# Patient Record
Sex: Male | Born: 1965 | Race: White | Hispanic: No | Marital: Married | Smoking: Never smoker
Health system: Southern US, Community
[De-identification: ages and names within clinical notes are randomized; demographics above are authoritative.]

## PROBLEM LIST (undated history)

## (undated) DIAGNOSIS — F419 Anxiety disorder, unspecified: Secondary | ICD-10-CM

## (undated) DIAGNOSIS — M1990 Unspecified osteoarthritis, unspecified site: Secondary | ICD-10-CM

## (undated) DIAGNOSIS — F32A Depression, unspecified: Secondary | ICD-10-CM

## (undated) DIAGNOSIS — K219 Gastro-esophageal reflux disease without esophagitis: Secondary | ICD-10-CM

## (undated) DIAGNOSIS — M19079 Primary osteoarthritis, unspecified ankle and foot: Secondary | ICD-10-CM

## (undated) HISTORY — PX: HERNIA REPAIR: SHX51

## (undated) HISTORY — PX: VASECTOMY: SHX75

## (undated) HISTORY — PX: APPENDECTOMY: SHX54

## (undated) HISTORY — PX: EYE SURGERY: SHX253

---

## 2004-03-25 ENCOUNTER — Emergency Department (HOSPITAL_COMMUNITY): Admission: EM | Admit: 2004-03-25 | Discharge: 2004-03-25 | Payer: Self-pay | Admitting: Emergency Medicine

## 2007-01-26 ENCOUNTER — Ambulatory Visit (HOSPITAL_BASED_OUTPATIENT_CLINIC_OR_DEPARTMENT_OTHER): Admission: RE | Admit: 2007-01-26 | Discharge: 2007-01-26 | Payer: Self-pay | Admitting: Orthopedic Surgery

## 2010-12-10 NOTE — Op Note (Signed)
Jeff Lee, Jeff Lee                   ACCOUNT NO.:  [id number]   MEDICAL RECORD NO.:  [id number]          PATIENT TYPE:  AMB   LOCATION:  DSC                          FACILITY:  [facility]   PHYSICIAN:  Leonides Grills, M.D.     DATE OF BIRTH:  1965-11-15   DATE OF PROCEDURE:  01/26/2007  DATE OF DISCHARGE:                               OPERATIVE REPORT   PREOPERATIVE DIAGNOSIS:  1. Left anterior ankle impingement.  2. Left anterior distal tibial spur.  3. Left ankle instability.   POSTOPERATIVE DIAGNOSIS:  1. Left anterior ankle impingement.  2. Left anterior distal tibial spur.  3. Left ankle instability.   OPERATION:  1. Left ankle arthroscopy with extensive debridement.  2. Excision left anterior distal tibial spurs.  3. Left modified Brostrom procedure.   ANESTHESIA:  General with block.   SURGEON:  Leonides Grills, M.D.   ASSISTANT:  Evlyn Kanner, P.A.-C.   ESTIMATED BLOOD LOSS:  Minimal.   TOURNIQUET TIME:  Approximately 1 hour.   COMPLICATIONS:  None.   DISPOSITION:  Stable to the PR.   INDICATIONS:  This is a 45 year old male who has had long-standing ankle  pain that is interfering with his life to the point that he could not do  what he wants to do.  He was consented for the above procedure.  All  risks which include infection, nerve and vessel injury, persistent pain,  worsening pain, prolonged recovery, stiffness, arthritis, and possible  future surgery, were all explained.  Questions were encouraged and  answered.   DESCRIPTION OF PROCEDURE:  The patient was brought to the operating room  and placed in supine position. After adequate general endotracheal  anesthesia was administered with block as well as Ancef 1 gram IV  piggyback, the patient was then placed in a sloppy lateral position with  the operative left side up on a bean bag.  All bony prominences were  well padded.  The left lower extremity was prepped and draped in a  sterile manner.  A proximally  placed thigh tourniquet was placed, the  limb was gravity exsanguinated, the tourniquet elevated to 290 mmHg.  Anatomical landmarks including anterior tibialis, peroneus and  superficial peroneal nerve were mapped out.   A spinal needle was then placed just medial to the anterior tibialis  tendon into the ankle and 20 mL of normal saline was instilled in the  ankle.  Weston Brass and spread technique was utilized to create the  anteromedial portal.  A blunt tip trocar with cannula followed by a  camera was then placed in the ankle. Under direct visualization,  anterolateral portal was created with a spinal needle followed by nick  and spread technique lateral to the peroneus tertius tendon and  superficial peroneal nerve.  Due to tremendous amount of synovitis over  the entire anterior aspect of the ankle and extending anteromedially and  anterolaterally, an extensive debridement was performed removing all  inflammatory tissue and dissecting out the anterior distal tibial  osteophyte.  This actually also extended into the anterior aspect of the  fibula which  had a large spur, as well, that required removal.  Once  debridement was done, pictures were obtained throughout the procedure,  the camera was removed.   We then made a longitudinal incision in the midline over the lateral  malleolus.  Dissection was carried down through the skin.  Hemostasis  was obtained.  The extensor retinaculum was then developed and freed up  for later advancement for the modification of the Brostrom procedure.  We then made an incision within the anterolateral capsule, approximately  2 mm from its insertion on the lateral malleolus.  Once this was opened,  we were able to see the osteophytes clearly. With an ankle arthrotomy  done and extending this dorsally, we removed the anterior fibular  osteophyte as well as anterior distal tibial osteophytes using a curved  corners osteotome and rongeur.  Once this was  completed, the range of  motion of the ankle had no impingement.  We then copiously irrigated the  area with normal saline.  There was isolated bone-on-bone arthritis of  the anterolateral aspect of the ankle that was about 1/3 of the joint  surface. The remaining part of cartilage looked in excellent shape.   We then prepared the anterior distal fibula and advanced the capsule  with the ankle in neutral dorsiflexion and eversion using mini-suture  anchors that are absorbable with 2-0 FiberWire from [employer]. This was  done with 2 mm drill holes.  Two anchors were placed.  Once the capsule  was advanced, we then repaired the rest of the capsule with 2-0  FiberWire to the remaining stump of capsule/periosteum. We then advanced  the extensor retinaculum and repaired this with 2-0 FiberWire stitch.  This had a good repair.  The area was copiously irrigated with normal  saline.  Range of motion of the ankle was performed.  The tourniquet was  deflated, hemostasis was obtained.  We copiously irrigated the area with  normal saline.  The subcu was closed with 3-0 Vicryl, the skin was  closed with 4-0 nylon over all wounds.  A sterile dressing was applied.  A modified Jones dressing was applied with the ankle in neutral  dorsiflexion and eversion. The patient was stable to the PR.      Leonides Grills, M.D.  Electronically Signed     PB/MEDQ  D:  01/26/2007  T:  01/26/2007  Job:  [id number]

## 2010-12-13 NOTE — Op Note (Signed)
NAME:  Jeff Lee, Jeff Lee                             ACCOUNT NO.:  [id number]   MEDICAL RECORD NO.:  [id number]                   PATIENT TYPE:  EMS   LOCATION:  MAJO                                 FACILITY:  [facility]   PHYSICIAN:  Nadara Mustard, M.D.                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  The patient was initially seen at [facility].  Vital signs at [facility]:  Blood pressure 138/110, pulse 80,  respiratory rate 20, weight 263 pounds, height 6 feet 3 inches.  The patient  was evaluated and felt to require a hand specialist.  The patient was then  referred to the [facility] on [facility] and after contact with the hand  center, they referred him to the emergency room and he is seen at this time  in consultation for Bethann Berkshire, MD for evaluation and treatment of the  left hand crush injury.   PAST MEDICAL HISTORY:  The injury occurred at approximately noon.  He was  seen in the emergency room at approximately 6:30 p.m.  The patient has  already received 1 g of Kefzol and his tetanus is up to date.   REVIEW OF SYSTEMS:  Negative.   HISTORY OF PRESENT ILLNESS:  As above.   SOCIAL HISTORY:  Does not smoke or drink.   PAST MEDICAL HISTORY:  Negative.   PHYSICAL EXAMINATION:  On physical exam, the patient is in moderate  distress, oriented times three, normal affect and mood.  On examination of  the left hand he has a crush injury to the long and ring finger.  The nail  is avulsed on the long finger.  The palmar aspect also has a full thickness  skin defect between the PIP and DIP joints on the palmar  aspect of both  fingers and this was a full thickness skin defect but does not go down to  the soft tissue.  Neurologically, the patient is intact.  Has decreased  sensation over the ring and long finger.  Vascular, he has good pulses.  Tendons are intact in all digits.  He is uninjured above the hand with the  shoulder, forearm and elbow intact.  The remainder of his exam was  essentially within normal limits with normal head, neck, chest and abdomen  exam.   Radiograph shows a fracture of the tuft of the left long and ring finger  with an open fracture of the long finger.   ASSESSMENT:  Crush injury, left hand, long and ring finger with tuft  fractures of the ring and long finger and an open fracture of the long  finger.   PLAN:  Patient's hand was initially soaked in peroxide, Betadine and saline.  After soaking, the hand was removed.  He underwent a digital block with 10  mL of 1% lidocaine plain of the long finger and the hand was then scrubbed  with a Betadine scrub brush on both the ring and long finger.  The hand was  then draped into a sterile field and again the hand was now irrigated with  normal saline.  The nail was removed from the long finger and he had a  stellate fracture across the nail bed.  This was cleansed again and was  closed with a 5-0 chromic gut.  The nail was cleansed and was placed back as  a biologic split and sutured in place.  Examination of the ring finger, the  subungual hematoma was decompressed and the nail was intact with no nail bed  injury that required repair. The subungual  hematoma was less than 50% of the nail.  The volar skin defects were covered  with Xeroform.  The fingers were also covered with Xeroform, sterile  dressing, Kerlix and a bulky dressing was applied.  The patient was then  discharged to home with prescriptions for Vicodin and Keflex with follow-up  in the office in two days.                                                Nadara Mustard, M.D.    MVD/MEDQ  D:  03/25/2004  T:  03/26/2004  Job:  [id number]

## 2017-08-26 ENCOUNTER — Other Ambulatory Visit: Payer: Self-pay | Admitting: Orthopedic Surgery

## 2017-09-09 NOTE — Pre-Procedure Instructions (Signed)
Jeff Lee  09/09/2017      [facility] - [place], [place] - [address] [place] [address] Phone: [phone] Fax: [phone]    Your procedure is scheduled on Thursday, Feb. 21st.   Report to [facility] Admitting at 5:30AM   Call this number if you have problems the morning of surgery:  [phone]   Remember:              4-5 days prior to surgery, STOP TAKING any Vitamins, Herbal Supplements, Anti-inflammatories, Blood thinners.   Do not eat food or drink liquids after midnight, Wednesday.   Take these medicines the morning of surgery - NOTHING   Do not wear jewelry - NO rings or watches.  Do not wear lotions, colognes or deodorant.  Men may shave face and neck.   Do not bring valuables to the hospital.  [facility] is not responsible for any belongings or valuables.  Contacts, dentures or bridgework may not be worn into surgery.  Leave your suitcase in the car.  After surgery it may be brought to your room.  For patients admitted to the hospital, discharge time will be determined by your treatment team.  Please read over the following fact sheets that you were given. Pain Booklet, MRSA Information and Surgical Site Infection Prevention      - Preparing For Surgery  Before surgery, you can play an important role. Because skin is not sterile, your skin needs to be as free of germs as possible. You can reduce the number of germs on your skin by washing with CHG (chlorahexidine gluconate) Soap before surgery.  CHG is an antiseptic cleaner which kills germs and bonds with the skin to continue killing germs even after washing.  Please do not use if you have an allergy to CHG or antibacterial soaps. If your skin becomes reddened/irritated stop using the CHG.  Do not shave (including legs and underarms) for at least 48 hours prior to first CHG shower. It is OK to shave your face.  Please follow these instructions  carefully.   1. Shower the NIGHT BEFORE SURGERY and the MORNING OF SURGERY with CHG.   2. If you chose to wash your hair, wash your hair first as usual with your normal shampoo.  3. After you shampoo, rinse your hair and body thoroughly to remove the shampoo.  4. Use CHG as you would any other liquid soap. You can apply CHG directly to the skin and wash gently with a scrungie or a clean washcloth.   5. Apply the CHG Soap to your body ONLY FROM THE NECK DOWN.  Do not use on open wounds or open sores. Avoid contact with your eyes, ears, mouth and genitals (private parts). Wash Face and genitals (private parts)  with your normal soap.  6. Wash thoroughly, paying special attention to the area where your surgery will be performed.  7. Thoroughly rinse your body with warm water from the neck down.  8. DO NOT shower/wash with your normal soap after using and rinsing off the CHG Soap.  9. Pat yourself dry with a CLEAN TOWEL.  10. Wear CLEAN PAJAMAS to bed the night before surgery, wear comfortable clothes the morning of surgery  11. Place CLEAN SHEETS on your bed the night of your first shower and DO NOT SLEEP WITH PETS.    Day of Surgery: Do not apply any deodorants/lotions. Please wear clean clothes to  the hospital/surgery center.

## 2017-09-10 ENCOUNTER — Encounter (HOSPITAL_COMMUNITY): Payer: Self-pay

## 2017-09-10 ENCOUNTER — Other Ambulatory Visit: Payer: Self-pay

## 2017-09-10 ENCOUNTER — Encounter (HOSPITAL_COMMUNITY)
Admission: RE | Admit: 2017-09-10 | Discharge: 2017-09-10 | Disposition: A | Discharge status: Home / Self Care | Payer: BLUE CROSS/BLUE SHIELD | Source: Ambulatory Visit | Attending: Orthopedic Surgery | Admitting: Orthopedic Surgery

## 2017-09-10 DIAGNOSIS — R002 Palpitations: Secondary | ICD-10-CM | POA: Insufficient documentation

## 2017-09-10 DIAGNOSIS — Z01810 Encounter for preprocedural cardiovascular examination: Secondary | ICD-10-CM | POA: Diagnosis present

## 2017-09-10 DIAGNOSIS — M19172 Post-traumatic osteoarthritis, left ankle and foot: Secondary | ICD-10-CM | POA: Diagnosis not present

## 2017-09-10 HISTORY — DX: Gastro-esophageal reflux disease without esophagitis: K21.9

## 2017-09-10 HISTORY — DX: Unspecified osteoarthritis, unspecified site: M19.90

## 2017-09-10 LAB — CBC
HCT: 41 % (ref 39.0–52.0)
Hemoglobin: 14.1 g/dL (ref 13.0–17.0)
MCH: 29.4 pg (ref 26.0–34.0)
MCHC: 34.4 g/dL (ref 30.0–36.0)
MCV: 85.4 fL (ref 78.0–100.0)
Platelets: 192 10*3/uL (ref 150–400)
RBC: 4.8 MIL/uL (ref 4.22–5.81)
RDW: 13.4 % (ref 11.5–15.5)
WBC: 5.8 10*3/uL (ref 4.0–10.5)

## 2017-09-10 LAB — SURGICAL PCR SCREEN
MRSA, PCR: NEGATIVE
Staphylococcus aureus: NEGATIVE

## 2017-09-10 LAB — BASIC METABOLIC PANEL
Anion gap: 8 (ref 5–15)
BUN: 16 mg/dL (ref 6–20)
CO2: 24 mmol/L (ref 22–32)
CREATININE: 0.88 mg/dL (ref 0.61–1.24)
Calcium: 9.1 mg/dL (ref 8.9–10.3)
Chloride: 107 mmol/L (ref 101–111)
GFR calc Af Amer: >60 mL/min (ref >60)
GLUCOSE: 98 mg/dL (ref 65–99)
POTASSIUM: 4.4 mmol/L (ref 3.5–5.1)
Sodium: 139 mmol/L (ref 135–145)

## 2017-09-10 NOTE — Progress Notes (Signed)
PCP is Dr. Grace BlightJ Slatosky at [facility]  LOV 05/2017 He states that approx 10 yrs ago, did have stress test for "palpitations".  Normal results.  Denies any irregularities now, no murmur, sob.

## 2017-09-16 NOTE — Anesthesia Preprocedure Evaluation (Addendum)
Anesthesia Evaluation  Patient identified by MRN, date of birth, ID band Patient awake    Reviewed: Allergy & Precautions, NPO status , Patient's Chart, lab work & pertinent test results  Airway Mallampati: III  TM Distance: >3 FB Neck ROM: Full    Dental no notable dental hx.    Pulmonary neg pulmonary ROS,    Pulmonary exam normal breath sounds clear to auscultation       Cardiovascular negative cardio ROS Normal cardiovascular exam Rhythm:Regular Rate:Normal  ECG: NSR, rate 62   Neuro/Psych negative neurological ROS  negative psych ROS   GI/Hepatic Neg liver ROS, GERD  Controlled,  Endo/Other  negative endocrine ROS  Renal/GU negative Renal ROS  negative genitourinary   Musculoskeletal negative musculoskeletal ROS (+)   Abdominal   Peds  Hematology negative hematology ROS (+)   Anesthesia Other Findings Left ankle post-traumatic arthritis  Reproductive/Obstetrics                            Anesthesia Physical Anesthesia Plan  ASA: II  Anesthesia Plan: General and Regional   Post-op Pain Management: GA combined w/ Regional for post-op pain   Induction: Intravenous  PONV Risk Score and Plan: 2 and Ondansetron, Dexamethasone, Midazolam and Treatment may vary due to age or medical condition  Airway Management Planned: Oral ETT  Additional Equipment:   Intra-op Plan:   Post-operative Plan: Extubation in OR  Informed Consent: I have reviewed the patients History and Physical, chart, labs and discussed the procedure including the risks, benefits and alternatives for the proposed anesthesia with the patient or authorized representative who has indicated his/her understanding and acceptance.   Dental advisory given  Plan Discussed with: CRNA  Anesthesia Plan Comments:         Anesthesia Quick Evaluation

## 2017-09-17 ENCOUNTER — Inpatient Hospital Stay (HOSPITAL_COMMUNITY): Payer: BLUE CROSS/BLUE SHIELD | Admitting: Anesthesiology

## 2017-09-17 ENCOUNTER — Encounter (HOSPITAL_COMMUNITY)
Admission: RE | Disposition: A | Discharge status: Home / Self Care | Payer: Self-pay | Source: Home / Self Care | Attending: Orthopedic Surgery

## 2017-09-17 ENCOUNTER — Other Ambulatory Visit: Payer: Self-pay

## 2017-09-17 ENCOUNTER — Inpatient Hospital Stay (HOSPITAL_COMMUNITY)
Admission: RE | Admit: 2017-09-17 | Discharge: 2017-09-18 | DRG: 469 | Disposition: A | Discharge status: Home / Self Care | Payer: BLUE CROSS/BLUE SHIELD | Attending: Orthopedic Surgery | Admitting: Orthopedic Surgery

## 2017-09-17 ENCOUNTER — Inpatient Hospital Stay (HOSPITAL_COMMUNITY): Payer: BLUE CROSS/BLUE SHIELD

## 2017-09-17 ENCOUNTER — Encounter (HOSPITAL_COMMUNITY): Payer: Self-pay

## 2017-09-17 DIAGNOSIS — Z96662 Presence of left artificial ankle joint: Secondary | ICD-10-CM

## 2017-09-17 DIAGNOSIS — K219 Gastro-esophageal reflux disease without esophagitis: Secondary | ICD-10-CM | POA: Diagnosis present

## 2017-09-17 DIAGNOSIS — M19172 Post-traumatic osteoarthritis, left ankle and foot: Principal | ICD-10-CM | POA: Diagnosis present

## 2017-09-17 DIAGNOSIS — Z419 Encounter for procedure for purposes other than remedying health state, unspecified: Secondary | ICD-10-CM

## 2017-09-17 DIAGNOSIS — M25572 Pain in left ankle and joints of left foot: Secondary | ICD-10-CM | POA: Diagnosis present

## 2017-09-17 HISTORY — DX: Primary osteoarthritis, unspecified ankle and foot: M19.079

## 2017-09-17 HISTORY — PX: TOTAL ANKLE ARTHROPLASTY: SHX811

## 2017-09-17 SURGERY — ARTHROPLASTY, ANKLE, TOTAL
Anesthesia: Regional | Site: Ankle | Laterality: Left

## 2017-09-17 MED ORDER — SODIUM CHLORIDE 0.9 % IV SOLN: INTRAVENOUS | Status: DC

## 2017-09-17 MED ORDER — DEXAMETHASONE SODIUM PHOSPHATE 10 MG/ML IJ SOLN
INTRAMUSCULAR | Status: DC | PRN
  Administered 2017-09-17: 5 mg via INTRAVENOUS

## 2017-09-17 MED ORDER — GLYCOPYRROLATE 0.2 MG/ML IJ SOLN
INTRAMUSCULAR | Status: DC | PRN
  Administered 2017-09-17: 0.4 mg via INTRAVENOUS

## 2017-09-17 MED ORDER — CALCIUM CARBONATE ANTACID 500 MG PO CHEW
1.0000 | CHEWABLE_TABLET | Freq: Two times a day (BID) | ORAL | Status: DC | PRN
  Administered 2017-09-17: 200 mg via ORAL
  Filled 2017-09-17: qty 1

## 2017-09-17 MED ORDER — ONDANSETRON HCL 4 MG/2ML IJ SOLN
INTRAMUSCULAR | Status: AC
  Filled 2017-09-17: qty 2

## 2017-09-17 MED ORDER — HYDROMORPHONE HCL 1 MG/ML IJ SOLN
0.2500 mg | INTRAMUSCULAR | Status: DC | PRN
  Administered 2017-09-17 (×2): 0.5 mg via INTRAVENOUS

## 2017-09-17 MED ORDER — HYDROMORPHONE HCL 1 MG/ML IJ SOLN
INTRAMUSCULAR | Status: AC
  Filled 2017-09-17: qty 1

## 2017-09-17 MED ORDER — ONDANSETRON HCL 4 MG/2ML IJ SOLN: 4.0000 mg | Freq: Four times a day (QID) | INTRAMUSCULAR | Status: DC | PRN

## 2017-09-17 MED ORDER — LIDOCAINE HCL (CARDIAC) 20 MG/ML IV SOLN
INTRAVENOUS | Status: DC | PRN
  Administered 2017-09-17: 40 mg via INTRAVENOUS
  Administered 2017-09-17: 60 mg via INTRAVENOUS

## 2017-09-17 MED ORDER — ROCURONIUM BROMIDE 10 MG/ML (PF) SYRINGE
PREFILLED_SYRINGE | INTRAVENOUS | Status: AC
  Filled 2017-09-17: qty 5

## 2017-09-17 MED ORDER — OXYCODONE HCL 5 MG PO TABS
5.0000 mg | ORAL_TABLET | ORAL | Status: DC | PRN
  Administered 2017-09-17 – 2017-09-18 (×3): 5 mg via ORAL
  Filled 2017-09-17 (×5): qty 1

## 2017-09-17 MED ORDER — METHOCARBAMOL 500 MG PO TABS
500.0000 mg | ORAL_TABLET | Freq: Four times a day (QID) | ORAL | Status: DC | PRN
Start: 2017-09-17 — End: 2017-09-18
  Administered 2017-09-17 – 2017-09-18 (×3): 500 mg via ORAL
  Filled 2017-09-17 (×3): qty 1

## 2017-09-17 MED ORDER — VANCOMYCIN HCL 500 MG IV SOLR
INTRAVENOUS | Status: DC | PRN
  Administered 2017-09-17: 500 mg

## 2017-09-17 MED ORDER — PROMETHAZINE HCL 25 MG/ML IJ SOLN: 6.2500 mg | INTRAMUSCULAR | Status: DC | PRN

## 2017-09-17 MED ORDER — ROCURONIUM BROMIDE 100 MG/10ML IV SOLN
INTRAVENOUS | Status: DC | PRN
  Administered 2017-09-17: 50 mg via INTRAVENOUS

## 2017-09-17 MED ORDER — FENTANYL CITRATE (PF) 100 MCG/2ML IJ SOLN
INTRAMUSCULAR | Status: DC | PRN
  Administered 2017-09-17: 25 ug via INTRAVENOUS
  Administered 2017-09-17: 100 ug via INTRAVENOUS
  Administered 2017-09-17 (×2): 50 ug via INTRAVENOUS
  Administered 2017-09-17: 100 ug via INTRAVENOUS
  Administered 2017-09-17: 50 ug via INTRAVENOUS
  Administered 2017-09-17: 25 ug via INTRAVENOUS
  Administered 2017-09-17 (×2): 50 ug via INTRAVENOUS

## 2017-09-17 MED ORDER — FENTANYL CITRATE (PF) 250 MCG/5ML IJ SOLN
INTRAMUSCULAR | Status: AC
  Filled 2017-09-17: qty 5

## 2017-09-17 MED ORDER — MIDAZOLAM HCL 2 MG/2ML IJ SOLN
INTRAMUSCULAR | Status: AC
  Filled 2017-09-17: qty 2

## 2017-09-17 MED ORDER — PROPOFOL 10 MG/ML IV BOLUS
INTRAVENOUS | Status: AC
  Filled 2017-09-17: qty 20

## 2017-09-17 MED ORDER — ARTIFICIAL TEARS OPHTHALMIC OINT
TOPICAL_OINTMENT | OPHTHALMIC | Status: DC | PRN
  Administered 2017-09-17: 1 via OPHTHALMIC

## 2017-09-17 MED ORDER — OXYCODONE HCL 5 MG/5ML PO SOLN: 5.0000 mg | Freq: Once | ORAL | Status: DC | PRN

## 2017-09-17 MED ORDER — MIDAZOLAM HCL 5 MG/5ML IJ SOLN
INTRAMUSCULAR | Status: DC | PRN
  Administered 2017-09-17 (×2): 1 mg via INTRAVENOUS

## 2017-09-17 MED ORDER — NEOSTIGMINE METHYLSULFATE 5 MG/5ML IV SOSY
PREFILLED_SYRINGE | INTRAVENOUS | Status: AC
  Filled 2017-09-17: qty 5

## 2017-09-17 MED ORDER — ARTIFICIAL TEARS OPHTHALMIC OINT
TOPICAL_OINTMENT | OPHTHALMIC | Status: AC
  Filled 2017-09-17: qty 3.5

## 2017-09-17 MED ORDER — MORPHINE SULFATE (PF) 2 MG/ML IV SOLN
2.0000 mg | INTRAVENOUS | Status: DC | PRN
  Administered 2017-09-17 – 2017-09-18 (×4): 2 mg via INTRAVENOUS
  Filled 2017-09-17 (×4): qty 1

## 2017-09-17 MED ORDER — ENOXAPARIN SODIUM 40 MG/0.4ML ~~LOC~~ SOLN
40.0000 mg | SUBCUTANEOUS | Status: DC
  Administered 2017-09-18: 40 mg via SUBCUTANEOUS
  Filled 2017-09-17: qty 0.4

## 2017-09-17 MED ORDER — CHLORHEXIDINE GLUCONATE 4 % EX LIQD: 60.0000 mL | Freq: Once | CUTANEOUS | Status: DC

## 2017-09-17 MED ORDER — CEFAZOLIN SODIUM-DEXTROSE 2-4 GM/100ML-% IV SOLN
2.0000 g | INTRAVENOUS | Status: AC
  Administered 2017-09-17: 2 g via INTRAVENOUS
  Filled 2017-09-17: qty 100

## 2017-09-17 MED ORDER — LACTATED RINGERS IV SOLN
INTRAVENOUS | Status: DC | PRN
  Administered 2017-09-17 (×2): via INTRAVENOUS

## 2017-09-17 MED ORDER — SODIUM CHLORIDE 0.9 % IV SOLN
INTRAVENOUS | Status: DC
  Administered 2017-09-17: 13:00:00 via INTRAVENOUS

## 2017-09-17 MED ORDER — ONDANSETRON HCL 4 MG/2ML IJ SOLN
INTRAMUSCULAR | Status: DC | PRN
  Administered 2017-09-17: 4 mg via INTRAVENOUS

## 2017-09-17 MED ORDER — DIPHENHYDRAMINE HCL 12.5 MG/5ML PO ELIX: 12.5000 mg | ORAL_SOLUTION | ORAL | Status: DC | PRN

## 2017-09-17 MED ORDER — LIDOCAINE 2% (20 MG/ML) 5 ML SYRINGE
INTRAMUSCULAR | Status: AC
  Filled 2017-09-17: qty 5

## 2017-09-17 MED ORDER — METHOCARBAMOL 1000 MG/10ML IJ SOLN
500.0000 mg | Freq: Four times a day (QID) | INTRAVENOUS | Status: DC | PRN
  Filled 2017-09-17: qty 5

## 2017-09-17 MED ORDER — ONDANSETRON HCL 4 MG PO TABS: 4.0000 mg | ORAL_TABLET | Freq: Four times a day (QID) | ORAL | Status: DC | PRN

## 2017-09-17 MED ORDER — OXYCODONE HCL 5 MG PO TABS: 5.0000 mg | ORAL_TABLET | Freq: Once | ORAL | Status: DC | PRN

## 2017-09-17 MED ORDER — DOCUSATE SODIUM 100 MG PO CAPS
100.0000 mg | ORAL_CAPSULE | Freq: Two times a day (BID) | ORAL | Status: DC
  Administered 2017-09-17 – 2017-09-18 (×3): 100 mg via ORAL
  Filled 2017-09-17 (×3): qty 1

## 2017-09-17 MED ORDER — PROPOFOL 10 MG/ML IV BOLUS
INTRAVENOUS | Status: DC | PRN
  Administered 2017-09-17: 20 mg via INTRAVENOUS
  Administered 2017-09-17: 250 mg via INTRAVENOUS

## 2017-09-17 MED ORDER — ACETAMINOPHEN 650 MG RE SUPP: 650.0000 mg | RECTAL | Status: DC | PRN

## 2017-09-17 MED ORDER — 0.9 % SODIUM CHLORIDE (POUR BTL) OPTIME
TOPICAL | Status: DC | PRN
  Administered 2017-09-17: 1000 mL

## 2017-09-17 MED ORDER — DEXAMETHASONE SODIUM PHOSPHATE 10 MG/ML IJ SOLN
INTRAMUSCULAR | Status: AC
  Filled 2017-09-17: qty 1

## 2017-09-17 MED ORDER — VANCOMYCIN HCL 500 MG IV SOLR
INTRAVENOUS | Status: AC
  Filled 2017-09-17: qty 500

## 2017-09-17 MED ORDER — ROPIVACAINE HCL 5 MG/ML IJ SOLN
INTRAMUSCULAR | Status: DC | PRN
  Administered 2017-09-17: 45 mL via PERINEURAL

## 2017-09-17 MED ORDER — ACETAMINOPHEN 325 MG PO TABS
650.0000 mg | ORAL_TABLET | ORAL | Status: DC | PRN
  Administered 2017-09-18: 650 mg via ORAL
  Filled 2017-09-17: qty 2

## 2017-09-17 MED ORDER — NEOSTIGMINE METHYLSULFATE 10 MG/10ML IV SOLN
INTRAVENOUS | Status: DC | PRN
  Administered 2017-09-17: 3 mg via INTRAVENOUS

## 2017-09-17 MED ORDER — SENNA 8.6 MG PO TABS
1.0000 | ORAL_TABLET | Freq: Two times a day (BID) | ORAL | Status: DC
  Administered 2017-09-17 – 2017-09-18 (×3): 8.6 mg via ORAL
  Filled 2017-09-17 (×3): qty 1

## 2017-09-17 SURGICAL SUPPLY — 58 items
BANDAGE ACE 4X5 VEL STRL LF (GAUZE/BANDAGES/DRESSINGS) ×2 IMPLANT
BANDAGE ACE 6X5 VEL STRL LF (GAUZE/BANDAGES/DRESSINGS) ×2 IMPLANT
BANDAGE ESMARK 6X9 LF (GAUZE/BANDAGES/DRESSINGS) ×1 IMPLANT
BLADE OSC (BLADE) ×2 IMPLANT
BLADE RECIP (BLADE) ×2 IMPLANT
BLADE RECIPRO TAPERED (BLADE) ×5 IMPLANT
BLADE SURG 15 STRL LF DISP TIS (BLADE) ×2 IMPLANT
BLADE SURG 15 STRL SS (BLADE) ×6
BNDG CMPR 9X6 STRL LF SNTH (GAUZE/BANDAGES/DRESSINGS) ×1
BNDG ESMARK 6X9 LF (GAUZE/BANDAGES/DRESSINGS) ×3
CHLORAPREP W/TINT 26ML (MISCELLANEOUS) ×3 IMPLANT
CORE POLY SLIDING 8MM ANKLE (Ankle) ×2 IMPLANT
COVER SURGICAL LIGHT HANDLE (MISCELLANEOUS) ×3 IMPLANT
CUFF TOURNIQUET SINGLE 34IN LL (TOURNIQUET CUFF) ×3 IMPLANT
DISPOSABLES PACK SBI (PACKS) ×2 IMPLANT
DRAPE C-ARM 42X72 X-RAY (DRAPES) ×3 IMPLANT
DRAPE C-ARMOR (DRAPES) ×3 IMPLANT
DRAPE HALF SHEET 40X57 (DRAPES) IMPLANT
DRAPE U-SHAPE 47X51 STRL (DRAPES) ×3 IMPLANT
DRSG MEPILEX BORDER 4X4 (GAUZE/BANDAGES/DRESSINGS) ×1 IMPLANT
DRSG MEPITEL 3X4 ME34 (GAUZE/BANDAGES/DRESSINGS) ×2 IMPLANT
DRSG MEPITEL 4X7.2 (GAUZE/BANDAGES/DRESSINGS) ×1 IMPLANT
DRSG PAD ABDOMINAL 8X10 ST (GAUZE/BANDAGES/DRESSINGS) ×6 IMPLANT
ELECT REM PT RETURN 9FT ADLT (ELECTROSURGICAL) ×3
ELECTRODE REM PT RTRN 9FT ADLT (ELECTROSURGICAL) ×1 IMPLANT
GAUZE SPONGE 4X4 12PLY STRL (GAUZE/BANDAGES/DRESSINGS) ×2 IMPLANT
GLOVE BIO SURGEON STRL SZ8 (GLOVE) ×3 IMPLANT
GLOVE BIOGEL PI IND STRL 8 (GLOVE) ×2 IMPLANT
GLOVE BIOGEL PI INDICATOR 8 (GLOVE) ×4
GLOVE ECLIPSE 8.0 STRL XLNG CF (GLOVE) ×3 IMPLANT
GOWN STRL REUS W/ TWL LRG LVL3 (GOWN DISPOSABLE) ×1 IMPLANT
GOWN STRL REUS W/ TWL XL LVL3 (GOWN DISPOSABLE) ×2 IMPLANT
GOWN STRL REUS W/TWL LRG LVL3 (GOWN DISPOSABLE) ×3 IMPLANT
GOWN STRL REUS W/TWL XL LVL3 (GOWN DISPOSABLE) ×6
GUIDEWIRE GAMMA (WIRE) ×2 IMPLANT
IMPLANT TALTA STAR SZ M LF (Orthopedic Implant) ×2 IMPLANT
IMPLANT TIBIAL STAR SZ XL (Ankle) ×2 IMPLANT
KIT BASIN OR (CUSTOM PROCEDURE TRAY) ×3 IMPLANT
KIT ROOM TURNOVER OR (KITS) ×3 IMPLANT
NS IRRIG 1000ML POUR BTL (IV SOLUTION) ×3 IMPLANT
PACK ORTHO EXTREMITY (CUSTOM PROCEDURE TRAY) ×3 IMPLANT
PAD ARMBOARD 7.5X6 YLW CONV (MISCELLANEOUS) ×6 IMPLANT
PAD CAST 4YDX4 CTTN HI CHSV (CAST SUPPLIES) ×3 IMPLANT
PADDING CAST COTTON 4X4 STRL (CAST SUPPLIES) ×9
PADDING CAST COTTON 6X4 STRL (CAST SUPPLIES) ×3 IMPLANT
SPLINT FIBERGLASS 4X30 (CAST SUPPLIES) ×2 IMPLANT
STOCKINETTE TUBULAR SYNTH 6IN (GAUZE/BANDAGES/DRESSINGS) ×3 IMPLANT
SUCTION FRAZIER TIP 10 FR DISP (SUCTIONS) ×2 IMPLANT
SURGILUBE 2OZ TUBE FLIPTOP (MISCELLANEOUS) ×3 IMPLANT
SUT ETHILON 3 0 PS 1 (SUTURE) ×6 IMPLANT
SUT MNCRL AB 3-0 PS2 18 (SUTURE) ×6 IMPLANT
SUT VIC AB 0 CT1 27 (SUTURE) ×6
SUT VIC AB 0 CT1 27XBRD ANBCTR (SUTURE) ×2 IMPLANT
TOWEL OR 17X24 6PK STRL BLUE (TOWEL DISPOSABLE) ×5 IMPLANT
TOWEL OR 17X26 10 PK STRL BLUE (TOWEL DISPOSABLE) ×3 IMPLANT
TUBE CONNECTING 12'X1/4 (SUCTIONS) ×1
TUBE CONNECTING 12X1/4 (SUCTIONS) ×2 IMPLANT
YANKAUER SUCT BULB TIP NO VENT (SUCTIONS) ×3 IMPLANT

## 2017-09-17 NOTE — Anesthesia Procedure Notes (Signed)
Procedure Name: Intubation Date/Time: 09/17/2017 7:41 AM Performed by: Coralee RudFlores, Sandy Blouch, CRNA Pre-anesthesia Checklist: Patient identified, Suction available, Emergency Drugs available, Patient being monitored and Timeout performed Patient Re-evaluated:Patient Re-evaluated prior to induction Oxygen Delivery Method: Circle system utilized Preoxygenation: Pre-oxygenation with 100% oxygen Induction Type: IV induction Ventilation: Mask ventilation without difficulty Laryngoscope Size: Miller and 3 Grade View: Grade I Tube type: Oral Tube size: 7.5 mm Number of attempts: 1 Airway Equipment and Method: Stylet Placement Confirmation: ETT inserted through vocal cords under direct vision,  positive ETCO2,  CO2 detector and breath sounds checked- equal and bilateral Secured at: 22 cm Tube secured with: Tape Dental Injury: Teeth and Oropharynx as per pre-operative assessment  Comments: Intubation by Lara MulchJohn Harris SRNA

## 2017-09-17 NOTE — Op Note (Signed)
09/17/2017  10:03 AM  PATIENT:  Jeff Lee  52 y.o. male  PRE-OPERATIVE DIAGNOSIS:  Left ankle post-traumatic arthritis  POST-OPERATIVE DIAGNOSIS:  Left ankle post-traumatic arthritis  Procedure(s): LEFT TOTAL ANKLE ARTHOPLASTY  SURGEON:  Toni ArthursJohn Andrus Sharp, MD  ASSISTANT: Alfredo MartinezJustin Ollis, PA-C  ANESTHESIA:   General, regional  EBL:  minimal   TOURNIQUET:   Total Tourniquet Time Documented: Thigh (Left) - 100 minutes Total: Thigh (Left) - 100 minutes  COMPLICATIONS:  None apparent  DISPOSITION:  Extubated, awake and stable to recovery.  INDICATION FOR PROCEDURE: The patient is a 52 year old male without significant past medical history.  He has a long history of left ankle pain due to posttraumatic arthritis.  He has failed nonoperative treatment to date including activity modification, oral anti-inflammatories, bracing and shoe wear modifications.  He presents today for left total ankle replacement.  The risks and benefits of the alternative treatment options have been discussed in detail.  The patient wishes to proceed with surgery and specifically understands risks of bleeding, infection, nerve damage, blood clots, need for additional surgery, amputation and death.  PROCEDURE IN DETAIL:  After pre operative consent was obtained, and the correct operative site was identified, the patient was brought to the operating room and placed supine on the OR table.  Anesthesia was administered.  Pre-operative antibiotics were administered.  A surgical timeout was taken.  The left lower extremity was prepped and draped in standard sterile fashion with a tourniquet around the thigh.  The extremity was elevated and the tourniquet was inflated to 300 mmHg.  A longitudinal incision was made over the anterior ankle joint.  Dissection was carried down through the subtenons tissues.  The retinaculum was incised over the EHL tendon.  The interval between the tibialis anterior and EHL was developed.  The  neurovascular bundle was identified.  It was mobilized and retracted laterally.  The joint capsule was incised and elevated medially and laterally.  There was end-stage arthrosis of the ankle joint with large anterior osteophytes.  These were removed with a rongeur.  There was valgus erosion at the lateral aspect of the tibial plafond.  The external tibial alignment guide was then applied and aligned with the medial gutter to set the appropriate rotation.  AP and lateral radiographs confirmed appropriate alignment of the guide with the tibia.  The cutting guide was pinned in place.  The appropriate resection level was set with a lateral radiograph and the distal cutting block was pinned.  An oscillating saw was then used to cut the distal tibia.  The waist bone was removed.  The talar cutting guide was then applied and pinned.  The appropriate position was confirmed on lateral radiograph.  The talar cut was then made in the waste bone removed.  The talus was sized as a medium.  The anterior posterior cutting guide was then applied after confirming appropriate position on the lateral radiograph.  Anterior and posterior cuts were made in waist bone removed.  The medial lateral guide was then applied and the medial and lateral cuts made.  After all bone was removed the window trial was applied.  It was noted to fit appropriately.  It was pinned and the keel hole drilled and punched.  The wound was irrigated copiously.  Vanco myosin powder was sprinkled on the cut surface of bone and the talar component was impacted in position.  Appropriate position was confirmed on x-ray.  The distal tibia was measured and an extra large tibial trial  inserted.  It was noted to be appropriately aligned.  A trial polyethylene spacer was inserted.  The barrel holes were drilled and punched.  The trial was removed.  Wound was irrigated copiously.  Vancomycin powder was sprinkled on the cut surface of bone.  The tibial implant was inserted  and impacted into position.  Trial poly-was inserted.  Appropriate position of the tibial component was confirmed on AP and lateral radiographs.  Ligament balance medially and laterally was appropriate.  Ankle dorsiflexion was 15 degrees with knee extended.  The trial poly-was removed and replaced with an 8 mm polyethylene spacer.  The wound was then irrigated copiously.  The barrel holes were bone grafted.  The anterior capsule was closed with 0 Vicryl.  The extensor retinaculum was closed with 0 Vicryl.  Subtenons tissues were approximated with Monocryl, and the skin incision was closed with nylon.  Sterile dressings were applied followed by a well-padded short leg cast.  The tourniquet was released after application of the dressings.  The patient was awakened from anesthesia and transported to the recovery room in stable condition.  FOLLOW UP PLAN: Nonweightbearing on the left lower extremity.  Observation overnight for pain control.  Plan discharge home tomorrow on aspirin for DVT reflexes.  Follow-up in the office in 3 weeks.   Alfredo Martinez PA-C was present and scrubbed for the duration of the operative case. His assistance assistance was essential in positioning the patient, prepping and draping, gaining maintaining exposure, performing the operation, closing and dressing the wounds and applying the splint.

## 2017-09-17 NOTE — Discharge Instructions (Signed)
Jeff Hewitt, MD °[facility] ° °Please read the following information regarding your care after surgery. ° °Medications  °You only need a prescription for the narcotic pain medicine (ex. oxycodone, Percocet, Norco).  All of the other medicines listed below are available over the counter. °X Aleve 2 pills twice a day for the first 3 days after surgery. °X acetominophen (Tylenol) 650 mg every 4-6 hours as you need for minor to moderate pain °X oxycodone as prescribed for severe pain ° °Narcotic pain medicine (ex. oxycodone, Percocet, Vicodin) will cause constipation.  To prevent this problem, take the following medicines while you are taking any pain medicine. °X docusate sodium (Colace) 100 mg twice a day X senna (Senokot) 2 tablets twice a day ° °X To help prevent blood clots, take a baby aspirin (81 mg) twice a day after surgery.  You should also get up every hour while you are awake to move around.   ° °Weight Bearing °X Do not bear any weight on the operated leg or foot. ° °Cast / Splint / Dressing °X Keep your splint, cast or dressing clean and dry.  Don’t put anything (coat hanger, pencil, etc) down inside of it.  If it gets damp, use a hair dryer on the cool setting to dry it.  If it gets soaked, call the office to schedule an appointment for a cast change. °  ° °After your dressing, cast or splint is removed; you may shower, but do not soak or scrub the wound.  Allow the water to run over it, and then gently pat it dry. ° °Swelling °It is normal for you to have swelling where you had surgery.  To reduce swelling and pain, keep your toes above your nose for at least 3 days after surgery.  It may be necessary to keep your foot or leg elevated for several weeks.  If it hurts, it should be elevated. ° °Follow Up °Call my office at [phone] when you are discharged from the hospital or surgery center to schedule an appointment to be seen two weeks after surgery. ° °Call my office at [phone] if  you develop a fever >101.5° F, nausea, vomiting, bleeding from the surgical site or severe pain.   ° ° °

## 2017-09-17 NOTE — Anesthesia Procedure Notes (Addendum)
Anesthesia Regional Block: Adductor canal block (and saphenous)   Pre-Anesthetic Checklist: ,, timeout performed, Correct Patient, Correct Site, Correct Laterality, Correct Procedure,, site marked, risks and benefits discussed, Surgical consent,  Pre-op evaluation,  At surgeon's request and post-op pain management  Laterality: Left  Prep: chloraprep       Needles:  Injection technique: Single-shot  Needle Type: Echogenic Stimulator Needle     Needle Length: 9cm  Needle Gauge: 21     Additional Needles:   Procedures:,,,, ultrasound used (permanent image in chart),,,,  Narrative:  Start time: 09/17/2017 7:05 AM End time: 09/17/2017 7:15 AM Injection made incrementally with aspirations every 5 mL.  Performed by: Personally  Anesthesiologist: Leonides GrillsEllender, Ryan P, MD  Additional Notes: Functioning IV was confirmed and monitors were applied.  A 90mm 21ga Arrow echogenic stimulator needle was used. Sterile prep, hand hygiene and sterile gloves were used.  Negative aspiration and negative test dose prior to incremental administration of local anesthetic. The patient tolerated the procedure well.

## 2017-09-17 NOTE — Transfer of Care (Cosign Needed)
Immediate Anesthesia Transfer of Care Note  Patient: Jeff Lee  Procedure(s) Performed: LEFT TOTAL ANKLE ARTHOPLASTY (Left Ankle)  Patient Location: PACU  Anesthesia Type:General  Level of Consciousness: awake, alert , oriented and patient cooperative  Airway & Oxygen Therapy: Patient Spontanous Breathing and Patient connected to nasal cannula oxygen  Post-op Assessment: Report given to RN, Post -op Vital signs reviewed and stable, Patient moving all extremities, Patient moving all extremities X 4 and Patient able to stick tongue midline  Post vital signs: Reviewed and stable  Last Vitals:  Vitals:   09/17/17 0715 09/17/17 0716  BP:    Pulse: 79 79  Resp: 16 17  Temp:    SpO2: 100% 100%    Last Pain:  Vitals:   09/17/17 0616  TempSrc: Oral      Patients Stated Pain Goal: 3 (09/17/17 [id number])  Complications: No apparent anesthesia complications

## 2017-09-17 NOTE — H&P (Signed)
Jeff MylarRick L Lee is an 52 y.o. male.   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  He has failed non op treatment and presents today for left total ankle replacement.  Past Medical History:  Diagnosis Date  . Arthritis   . Arthritis of ankle    Post Traumatic, left  . GERD (gastroesophageal reflux disease)    TAKES OTC    Past Surgical History:  Procedure Laterality Date  . APPENDECTOMY    . EYE SURGERY     LASER EYE & MUSCLE CORRECTION  . HERNIA REPAIR     UMBILICAL & INGUINAL  . VASECTOMY      History reviewed. No pertinent family history. Social History:  reports that  has never smoked. he has never used smokeless tobacco. He reports that he drinks about 2.4 oz of alcohol per week. He reports that he does not use drugs.  Allergies: No Known Allergies  Medications Prior to Admission  Medication Sig Dispense Refill  . acetaminophen (TYLENOL) 500 MG tablet Take 2,000 mg by mouth daily as needed for moderate pain or headache.    . ibuprofen (ADVIL,MOTRIN) 200 MG tablet Take 400 mg by mouth daily as needed for headache or moderate pain.    . Menthol, Topical Analgesic, (BLUE-EMU MAXIMUM STRENGTH EX) Apply 1 application topically daily as needed (pain).    . Multiple Vitamin (MULTIVITAMIN WITH MINERALS) TABS tablet Take 1 tablet by mouth daily.      No results found for this or any previous visit (from the past 48 hour(s)). No results found.  ROS  No recent f/c/n/v/wt loss  Blood pressure (!) 152/59, pulse 79, temperature 98.4 F (36.9 C), temperature source Oral, resp. rate 17, height 6\' 3"  (1.905 m), weight 119.3 kg (263 lb), SpO2 100 %. Physical Exam  wn wd male in nad.  A and o x 4.  Mood and affect nromal.  EOMI.  resp unlabored.  L ankle with health skin.  No lymphadenopathy.  5/5 strength in PF and DF of the ankle and toes.  Sens to LT intact at the anterior ankle, dorsal and plantar foot.  Slight swelling at the ankle.   Small effusion.  2+ dp and pt pulses.  Assessment/Plan L ankle post traumatic arthritis - to OR for left total ankle replacement.  The risks and benefits of the alternative treatment options have been discussed in detail.  The patient wishes to proceed with surgery and specifically understands risks of bleeding, infection, nerve damage, blood clots, need for additional surgery, amputation and death.   Toni ArthursHEWITT, Evie Crumpler, MD 09/17/2017, 7:20 AM

## 2017-09-17 NOTE — Anesthesia Postprocedure Evaluation (Signed)
Anesthesia Post Note  Patient: Jeff Lee  Procedure(s) Performed: LEFT TOTAL ANKLE ARTHOPLASTY (Left Ankle)     Patient location during evaluation: PACU Anesthesia Type: Regional and General Level of consciousness: awake and alert Pain management: pain level controlled Vital Signs Assessment: post-procedure vital signs reviewed and stable Respiratory status: spontaneous breathing, nonlabored ventilation, respiratory function stable and patient connected to nasal cannula oxygen Cardiovascular status: blood pressure returned to baseline and stable Postop Assessment: no apparent nausea or vomiting Anesthetic complications: no    Last Vitals:  Vitals:   09/17/17 1123 09/17/17 1148  BP:  136/76  Pulse: 72 63  Resp: 14 16  Temp:    SpO2: 96% 98%    Last Pain:  Vitals:   09/17/17 1428  TempSrc:   PainSc: 3     LLE Motor Response: No movement due to regional block;Other (Comment)(cap refills normal, skin pink and warm) (09/17/17 1433) LLE Sensation: Numbness;Tingling;No pain (09/17/17 1433)          Catheryn Baconyan P Deborah Lazcano

## 2017-09-17 NOTE — Evaluation (Signed)
Physical Therapy Evaluation and Discharge Patient Details Name: Jeff MylarRick L Lee MRN: [id number] DOB: [birth year] Today's Date: 09/17/2017   History of Present Illness  52 y.o. male s/p L total ankle arthroplasty 2/21.   Clinical Impression  Patient evaluated by Physical Therapy with no further acute PT needs identified. All education has been completed and the patient has no further questions. PTA, pt independent with all mobility driving and working. Pt lives with wife who will be avail and has family local to assist after disharge in 1 story home with stair to enter. Upon eval, pt mod I with all mobility with little post op pain or weakness. Ambulating hallway with assistive devices and performing stairs without physical assistance. Pt reports confidence in returning to home and recommended eventual follow up with OP ortho after surgery has healed.  See below for any follow-up Physical Therapy or equipment needs. PT is signing off. Thank you for this referral.      Follow Up Recommendations No PT follow up;Supervision for mobility/OOB    Equipment Recommendations  None recommended by PT    Recommendations for Other Services       Precautions / Restrictions Precautions Precautions: None Restrictions Weight Bearing Restrictions: Yes LLE Weight Bearing: Non weight bearing      Mobility  Bed Mobility Overal bed mobility: Modified Independent                Transfers Overall transfer level: Modified independent Equipment used: Rolling walker (2 wheeled)                Ambulation/Gait Ambulation/Gait assistance: Modified independent (Device/Increase time) Ambulation Distance (Feet): 100 Feet Assistive device: Rolling walker (2 wheeled)(Knee Walker) Gait Pattern/deviations: Step-to pattern Gait velocity: decreased   General Gait Details: pt safe with adherence to NWB status. ambulated 50 feet with RW, then 100 feet with knee walker. preferes knee  walker.  Stairs Stairs: Yes Stairs assistance: Supervision Stair Management: No rails;Forwards Number of Stairs: 3 General stair comments: 3x1 platform cues for technique with RW. pt safe and able to perform without phsical assistance.   Wheelchair Mobility    Modified Rankin (Stroke Patients Only)       Balance Overall balance assessment: No apparent balance deficits (not formally assessed);History of Falls(can stand 1 leg without BUE support. BUE support for dynamic)                                           Pertinent Vitals/Pain Pain Assessment: No/denies pain    Home Living Family/patient expects to be discharged to:: Private residence Living Arrangements: Spouse/significant other Available Help at Discharge: Friend(s);Available 24 hours/day Type of Home: House Home Access: Stairs to enter Entrance Stairs-Rails: Can reach both Entrance Stairs-Number of Steps: 1 Home Layout: One level Home Equipment: Shower seat;Cane - quad;Walker - 2 wheels      Prior Function Level of Independence: Independent         Comments: independent all mobility, driving, working.     Hand Dominance        Extremity/Trunk Assessment   Upper Extremity Assessment Upper Extremity Assessment: Overall WFL for tasks assessed    Lower Extremity Assessment Lower Extremity Assessment: Overall WFL for tasks assessed    Cervical / Trunk Assessment Cervical / Trunk Assessment: Normal  Communication   Communication: No difficulties  Cognition Arousal/Alertness: Awake/alert  General Comments      Exercises     Assessment/Plan    PT Assessment Patent does not need any further PT services  PT Problem List         PT Treatment Interventions      PT Goals (Current goals can be found in the Care Plan section)  Acute Rehab PT Goals Patient Stated Goal: return home tomorrow PT Goal Formulation:  With patient Time For Goal Achievement: 09/24/17 Potential to Achieve Goals: Good    Frequency     Barriers to discharge        Co-evaluation               AM-PAC PT "6 Clicks" Daily Activity  Outcome Measure Difficulty turning over in bed (including adjusting bedclothes, sheets and blankets)?: None Difficulty moving from lying on back to sitting on the side of the bed? : None Difficulty sitting down on and standing up from a chair with arms (e.g., wheelchair, bedside commode, etc,.)?: None Help needed moving to and from a bed to chair (including a wheelchair)?: None Help needed walking in hospital room?: None Help needed climbing 3-5 steps with a railing? : A Little 6 Click Score: 23    End of Session Equipment Utilized During Treatment: Gait belt Activity Tolerance: Patient tolerated treatment well Patient left: in bed;with call bell/phone within reach Nurse Communication: Mobility status PT Visit Diagnosis: Other abnormalities of gait and mobility (R26.89)    Time: 1610-9604 PT Time Calculation (min) (ACUTE ONLY): 32 min   Charges:   PT Evaluation $PT Eval Low Complexity: 1 Low PT Treatments $Gait Training: 8-22 mins   PT G Codes:        Etta Grandchild, PT, DPT Acute Rehab Services Pager: [phone]    Etta Grandchild 09/17/2017, 5:04 PM

## 2017-09-18 ENCOUNTER — Encounter (HOSPITAL_COMMUNITY): Payer: Self-pay | Admitting: Orthopedic Surgery

## 2017-09-18 MED ORDER — ACETAMINOPHEN 500 MG PO TABS: 1000.0000 mg | ORAL_TABLET | Freq: Every day | ORAL | 0 refills | Status: AC | PRN

## 2017-09-18 MED ORDER — ASPIRIN EC 81 MG PO TBEC: 81.0000 mg | DELAYED_RELEASE_TABLET | Freq: Two times a day (BID) | ORAL | 0 refills | Status: DC

## 2017-09-18 MED ORDER — OXYCODONE HCL 5 MG PO TABS: 5.0000 mg | ORAL_TABLET | ORAL | 0 refills | Status: DC | PRN

## 2017-09-18 MED ORDER — SENNA 8.6 MG PO TABS: 2.0000 | ORAL_TABLET | Freq: Two times a day (BID) | ORAL | 0 refills | Status: DC

## 2017-09-18 MED ORDER — DOCUSATE SODIUM 100 MG PO CAPS: 100.0000 mg | ORAL_CAPSULE | Freq: Two times a day (BID) | ORAL | 0 refills | Status: DC

## 2017-09-18 NOTE — Progress Notes (Signed)
Subjective: 1 Day Post-Op Procedure(s) (LRB): LEFT TOTAL ANKLE ARTHOPLASTY (Left)  Patient reports pain as mild to moderate.  Tolerating POs well.  Admits to flatus.  Denies fever, chills, N/V, SOB, CP.  Reports that he is ready to go home.  Objective:   VITALS:  Temp:  [97.4 F (36.3 C)-99.1 F (37.3 C)] 98.3 F (36.8 C) (02/22 0555) Pulse Rate:  [59-89] 83 (02/22 0555) Resp:  [8-20] 17 (02/22 0555) BP: (114-142)/(53-81) 122/53 (02/22 0555) SpO2:  [92 %-99 %] 96 % (02/22 0555)  General: WDWN patient in NAD. Psych:  Appropriate mood and affect. Neuro:  A&O x 3, Moving all extremities, sensation intact to light touch HEENT:  EOMs intact Chest:  Even non-labored respirations Skin:  SLC C/D/I, no rashes or lesions Extremities: warm/dry, no visible edema, erythema, or echymosis.  No lymphadenopathy. Pulses: Popliteus 2+ MSK:  ROM: EHL/FHL intact, MMT: able to perform quad set    LABS No results for input(s): HGB, WBC, PLT in the last 72 hours. No results for input(s): NA, K, CL, CO2, BUN, CREATININE, GLUCOSE in the last 72 hours. No results for input(s): LABPT, INR in the last 72 hours.   Assessment/Plan: 1 Day Post-Op Procedure(s) (LRB): LEFT TOTAL ANKLE ARTHOPLASTY (Left)  NWB LLE D/C home today Scripts on chart ASA for DVT prophylaxis Plan for 3 week outpatient post-op visit with Dr. Victorino DikeHewitt.  Alfredo MartinezJustin Paisly [place], PA-C [facility] Office:  [phone]

## 2017-09-18 NOTE — Progress Notes (Signed)
Discharge instructions (including medications) discussed with and copy provided to patient/caregiver 

## 2017-09-18 NOTE — Care Management (Signed)
Patient has no case management needs. Has DME.

## 2017-09-18 NOTE — Discharge Summary (Signed)
Physician Discharge Summary  Patient ID: Jeff MylarRick L Lee MRN: [id number] DOB/AGE: May 30, 1966 52 y.o.  Admit date: 09/17/2017 Discharge date: 09/18/2017  Admission Diagnoses: L ankle osteoarthritis; GERD  Discharge Diagnoses:  Active Problems:   H/O total ankle replacement, left Same as above  Discharged Condition: stable  Hospital Course: [facility] for elective L total ankle replacement by Dr. Toni ArthursJohn Hewitt.  The patient tolerated the procedure well and was admitted to the hospital.  The patient worked well with therapy and was D/C'd home on 09/18/17.  He tolerated his stay well without any complications.  Consults: PT  Significant Diagnostic Studies: radiology: X-Ray: to ensure satisfactory anatomic alignment during operative procedure.  Treatments: IV hydration, antibiotics: Ancef, analgesia: acetaminophen, Vicodin and Morphine, anticoagulation: lovenox and surgery: as stated above.  Discharge Exam: Blood pressure (!) 122/53, pulse 83, temperature 98.3 F (36.8 C), temperature source Oral, resp. rate 17, height 6\' 3"  (1.905 m), weight 119.3 kg (263 lb), SpO2 96 %. General: WDWN patient in NAD. Psych:  Appropriate mood and affect. Neuro:  A&O x 3, Moving all extremities, sensation intact to light touch HEENT:  EOMs intact Chest:  Even non-labored respirations Skin:  SLC C/D/I, no rashes or lesions Extremities: warm/dry, no visible edema, erythema, or echymosis.  No lymphadenopathy. Pulses: Popliteus 2+ MSK:  ROM: EHL/FHL intact, MMT: able to perform quad set   Disposition: Home/self care  Discharge Instructions    Call MD / Call 911   Complete by:  As directed    If you experience chest pain or shortness of breath, CALL 911 and be transported to the hospital emergency room.  If you develope a fever above 101 F, pus (white drainage) or increased drainage or redness at the wound, or calf pain, call your surgeon's office.   Constipation Prevention    Complete by:  As directed    Drink plenty of fluids.  Prune juice may be helpful.  You may use a stool softener, such as Colace (over the counter) 100 mg twice a day.  Use MiraLax (over the counter) for constipation as needed.   Diet - low sodium heart healthy   Complete by:  As directed    Increase activity slowly as tolerated   Complete by:  As directed    Non weight bearing   Complete by:  As directed    Laterality:  left   Extremity:  Lower     Allergies as of 09/18/2017   No Known Allergies     Medication List    TAKE these medications   acetaminophen 500 MG tablet Commonly known as:  TYLENOL Take 2 tablets (1,000 mg total) by mouth daily as needed for moderate pain or headache. What changed:  how much to take   aspirin EC 81 MG tablet Take 1 tablet (81 mg total) by mouth 2 (two) times daily.   BLUE-EMU MAXIMUM STRENGTH EX Apply 1 application topically daily as needed (pain).   docusate sodium 100 MG capsule Commonly known as:  COLACE Take 1 capsule (100 mg total) by mouth 2 (two) times daily. While taking narcotic pain medicine.   ibuprofen 200 MG tablet Commonly known as:  ADVIL,MOTRIN Take 400 mg by mouth daily as needed for headache or moderate pain.   multivitamin with minerals Tabs tablet Take 1 tablet by mouth daily.   oxyCODONE 5 MG immediate release tablet Commonly known as:  ROXICODONE Take 1 tablet (5 mg total) by mouth every 4 (four)  hours as needed for moderate pain or severe pain. For no more than 5 days.   senna 8.6 MG Tabs tablet Commonly known as:  SENOKOT Take 2 tablets (17.2 mg total) by mouth 2 (two) times daily.            Discharge Care Instructions  (From admission, onward)        Start     Ordered   09/18/17 0000  Non weight bearing    Question Answer Comment  Laterality left   Extremity Lower      09/18/17 4098     Follow-up Information    Toni Arthurs, MD. Schedule an appointment as soon as possible for a visit in 3  week(s).   Specialty:  Orthopedic Surgery Contact information: [address] [place] [address] [phone]           Signed: Joana Reamer [facility] Office:  [phone]

## 2018-05-25 IMAGING — RF DG C-ARM 61-120 MIN
1 series · 5 of 5 positions shown · non-contrast
Comparison: No prior.

CLINICAL DATA: Total left ankle replacement.

EXAM:
LEFT ANKLE - 2 VIEW; DG C-ARM 61-120 MIN

[Series 1: run · 5 of 5 slices shown]
[im 1/5]
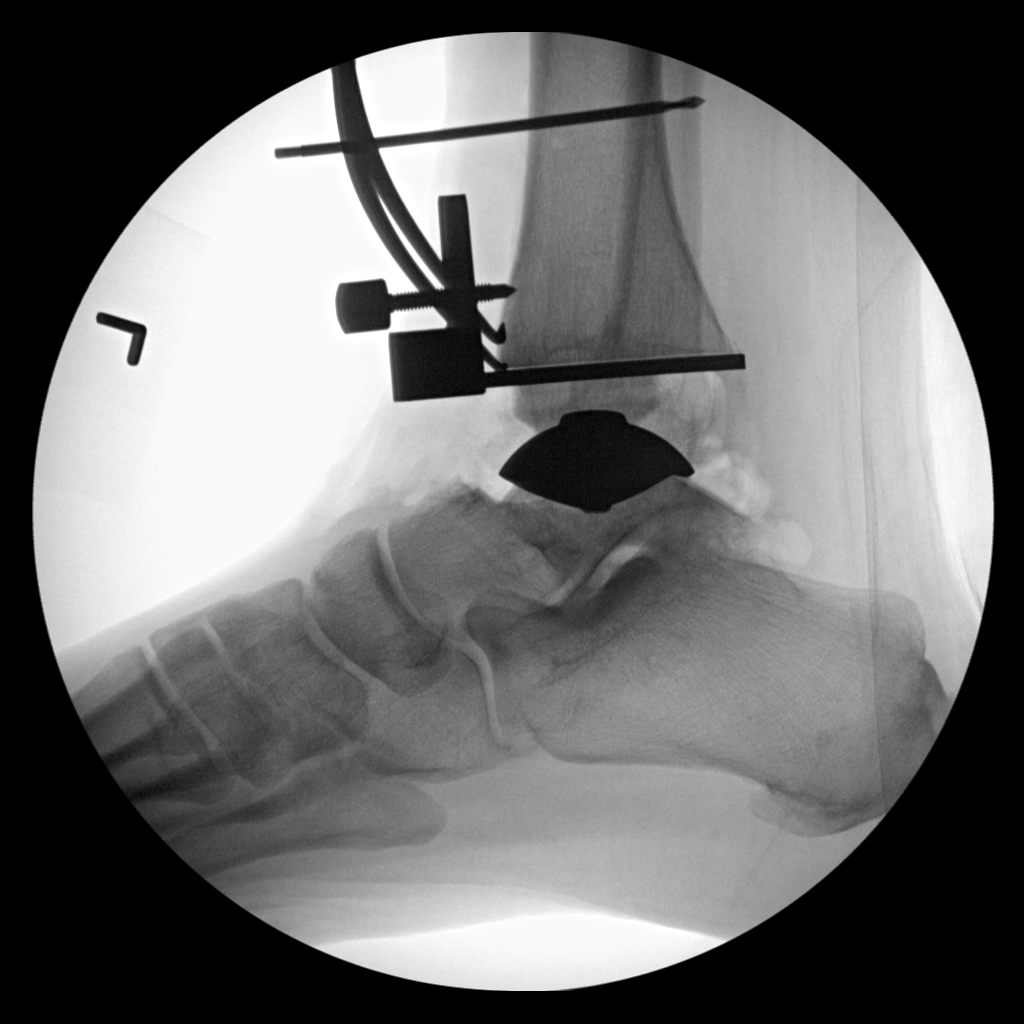
[im 2/5]
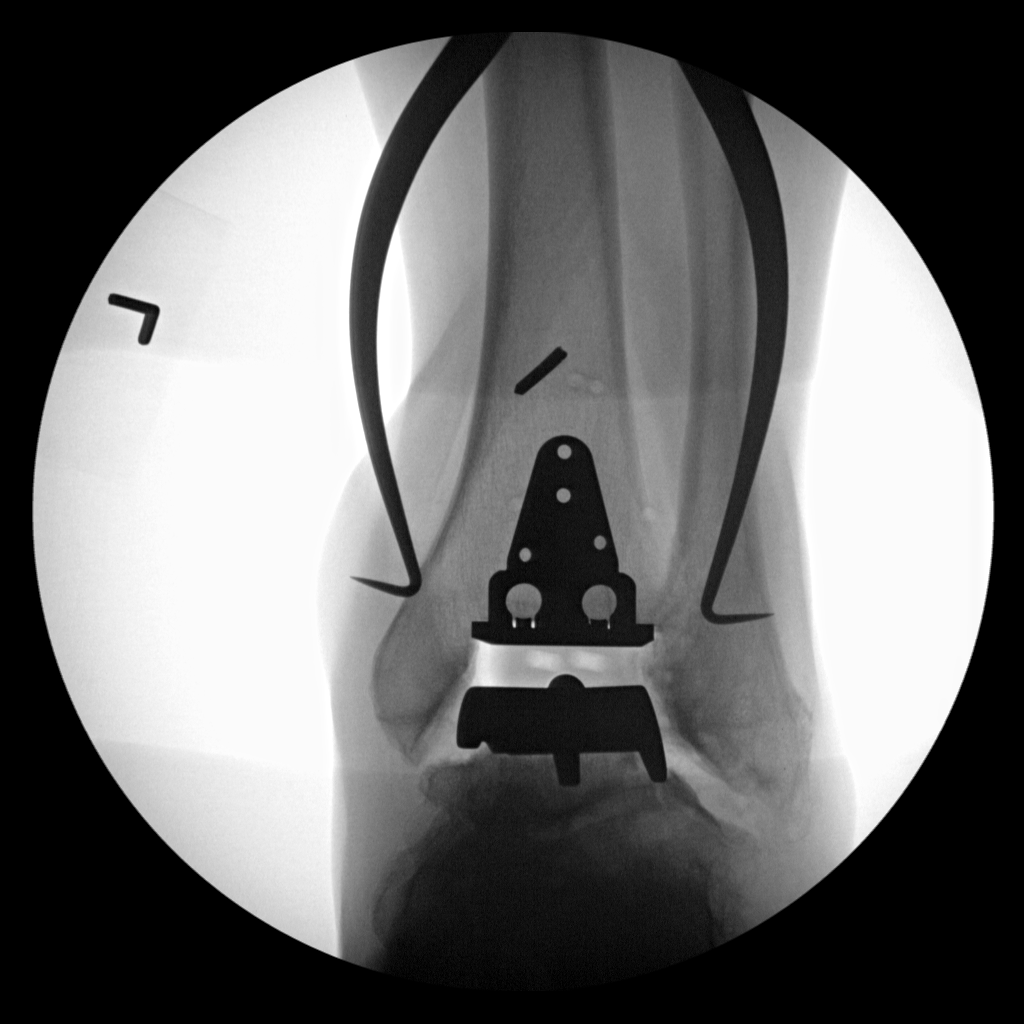
[im 3/5]
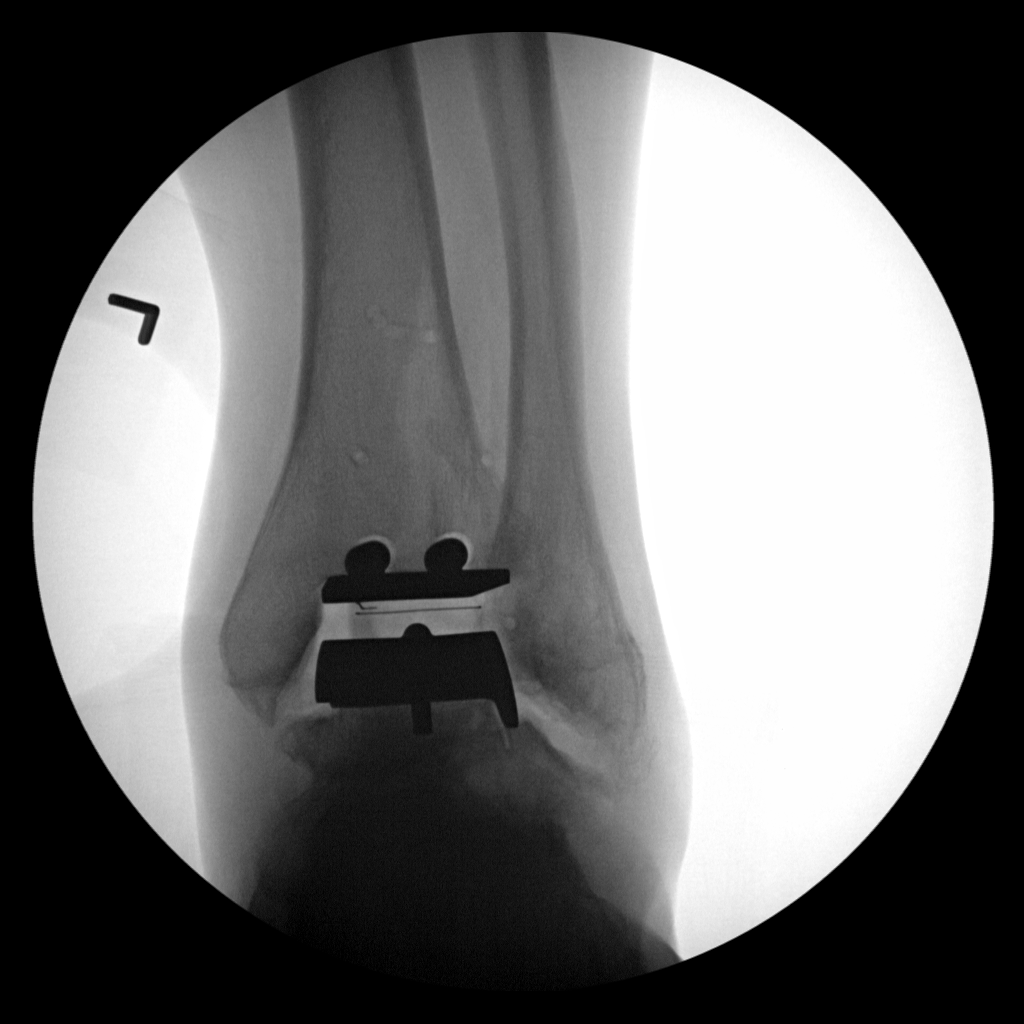
[im 4/5]
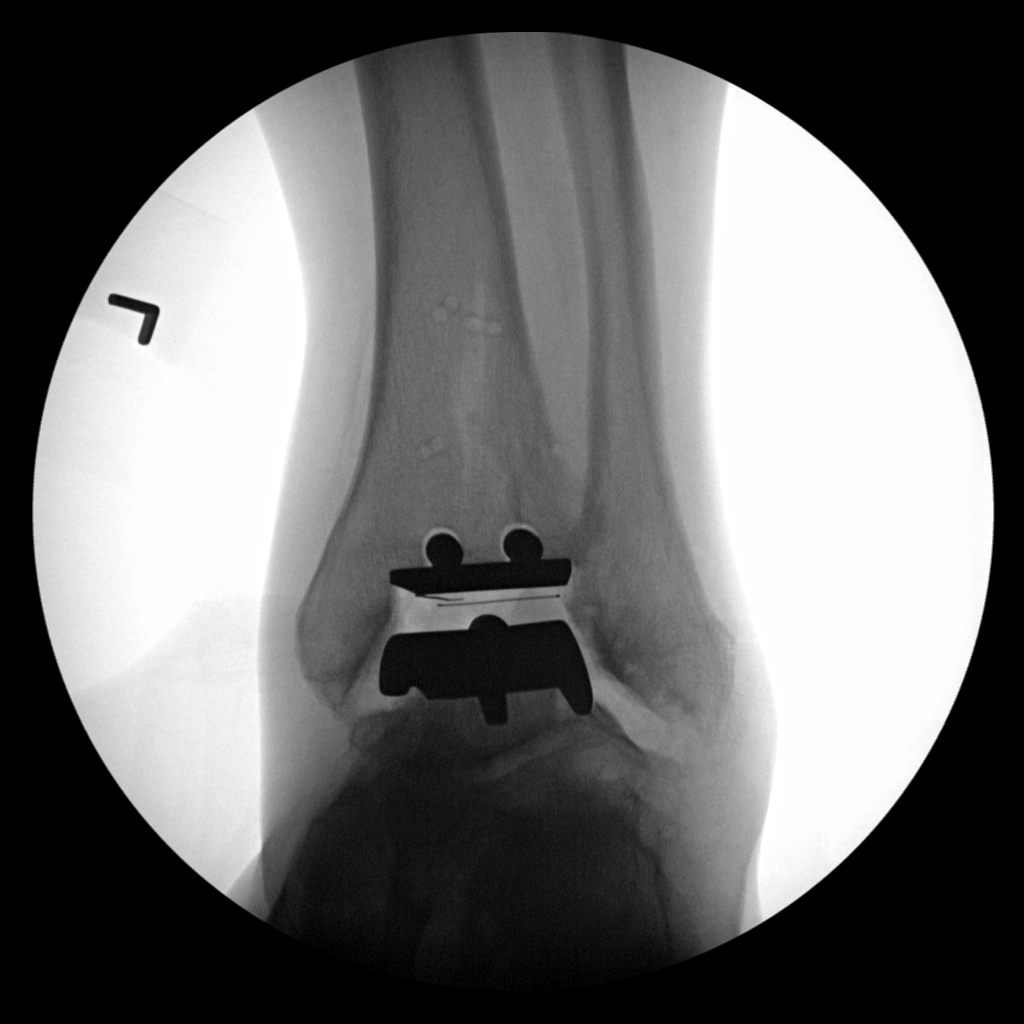
[im 5/5]
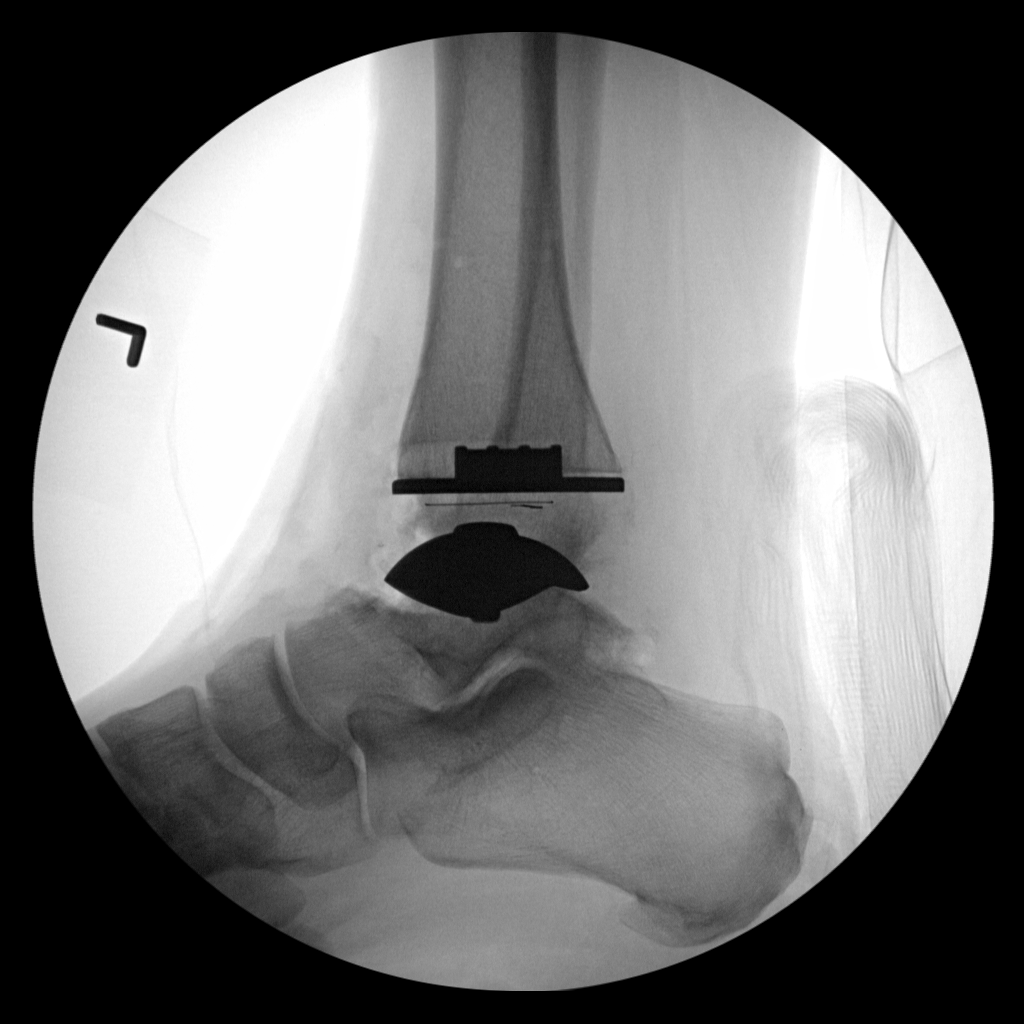

[5 of 5 positions shown; findings below may reference images not displayed]

FINDINGS: Total left ankle replacement.  Hardware intact.  Anatomic alignment.
IMPRESSION: Total left ankle replacement with anatomic alignment.

## 2018-09-03 DIAGNOSIS — M25872 Other specified joint disorders, left ankle and foot: Secondary | ICD-10-CM | POA: Diagnosis not present

## 2018-09-22 DIAGNOSIS — F331 Major depressive disorder, recurrent, moderate: Secondary | ICD-10-CM | POA: Diagnosis not present

## 2018-10-05 DIAGNOSIS — F331 Major depressive disorder, recurrent, moderate: Secondary | ICD-10-CM | POA: Diagnosis not present

## 2018-10-19 DIAGNOSIS — F331 Major depressive disorder, recurrent, moderate: Secondary | ICD-10-CM | POA: Diagnosis not present

## 2018-10-20 DIAGNOSIS — F331 Major depressive disorder, recurrent, moderate: Secondary | ICD-10-CM | POA: Diagnosis not present

## 2018-11-02 DIAGNOSIS — F331 Major depressive disorder, recurrent, moderate: Secondary | ICD-10-CM | POA: Diagnosis not present

## 2018-11-16 DIAGNOSIS — F331 Major depressive disorder, recurrent, moderate: Secondary | ICD-10-CM | POA: Diagnosis not present

## 2018-12-14 DIAGNOSIS — F331 Major depressive disorder, recurrent, moderate: Secondary | ICD-10-CM | POA: Diagnosis not present

## 2018-12-28 DIAGNOSIS — F331 Major depressive disorder, recurrent, moderate: Secondary | ICD-10-CM | POA: Diagnosis not present

## 2019-01-24 DIAGNOSIS — F331 Major depressive disorder, recurrent, moderate: Secondary | ICD-10-CM | POA: Diagnosis not present

## 2019-02-04 ENCOUNTER — Emergency Department (HOSPITAL_BASED_OUTPATIENT_CLINIC_OR_DEPARTMENT_OTHER)
Admission: EM | Admit: 2019-02-04 | Discharge: 2019-02-04 | Disposition: A | Discharge status: Home / Self Care | Payer: No Typology Code available for payment source | Attending: Emergency Medicine | Admitting: Emergency Medicine

## 2019-02-04 ENCOUNTER — Emergency Department (HOSPITAL_BASED_OUTPATIENT_CLINIC_OR_DEPARTMENT_OTHER): Payer: No Typology Code available for payment source

## 2019-02-04 ENCOUNTER — Encounter (HOSPITAL_BASED_OUTPATIENT_CLINIC_OR_DEPARTMENT_OTHER): Payer: Self-pay | Admitting: *Deleted

## 2019-02-04 ENCOUNTER — Other Ambulatory Visit: Payer: Self-pay

## 2019-02-04 DIAGNOSIS — Z96652 Presence of left artificial knee joint: Secondary | ICD-10-CM | POA: Diagnosis not present

## 2019-02-04 DIAGNOSIS — W1789XA Other fall from one level to another, initial encounter: Secondary | ICD-10-CM | POA: Diagnosis not present

## 2019-02-04 DIAGNOSIS — M25572 Pain in left ankle and joints of left foot: Secondary | ICD-10-CM | POA: Diagnosis not present

## 2019-02-04 DIAGNOSIS — Z79899 Other long term (current) drug therapy: Secondary | ICD-10-CM | POA: Diagnosis not present

## 2019-02-04 NOTE — ED Triage Notes (Signed)
W/C. Left ankle injury from fall from step ladder at work.  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

## 2019-02-05 NOTE — ED Provider Notes (Addendum)
Emergency Department Provider Note   I have reviewed the triage vital signs and the nursing notes.   HISTORY  Chief Complaint Ankle Pain   HPI Jeff Lee is a 53 y.o. male fell approximately 2 feet onto his left foot with a subsequent inversion injury of his left ankle and now having pain to left lateral ankle and medial foot.  Didn't injury anything else in fall.  Difficulty bearing weight. didn't take meds PTA.  No other associated or modifying symptoms.    Past Medical History:  Diagnosis Date  . Arthritis   . Arthritis of ankle    Post Traumatic, left  . GERD (gastroesophageal reflux disease)    TAKES OTC    Patient Active Problem List   Diagnosis Date Noted  . H/O total ankle replacement, left 09/17/2017    Past Surgical History:  Procedure Laterality Date  . APPENDECTOMY    . EYE SURGERY     LASER EYE & MUSCLE CORRECTION  . HERNIA REPAIR     UMBILICAL & INGUINAL  . TOTAL ANKLE ARTHROPLASTY Left 09/17/2017   Procedure: LEFT TOTAL ANKLE ARTHOPLASTY;  Surgeon: Wylene Simmer, MD;  Location: [facility];  Service: Orthopedics;  Laterality: Left;  Marland Kitchen VASECTOMY      Current Outpatient Rx  . Order #: [id number] Class: Print  . Order #: [id number] Class: Print  . Order #: [id number] Class: Print  . Order #: [id number] Class: Historical Med  . Order #: [id number] Class: Historical Med  . Order #: [id number] Class: Historical Med  . Order #: [id number] Class: Print  . Order #: [id number] Class: Print    Allergies Patient has no known allergies.  History reviewed. No pertinent family history.  Social History Social History   Tobacco Use  . Smoking status: Never Smoker  . Smokeless tobacco: Never Used  Substance Use Topics  . Alcohol use: Yes    Alcohol/week: 4.0 standard drinks    Types: 4 Cans of beer per week    Comment: OCCASIONALLY  . Drug use: No    Review of Systems  All other systems negative except as documented in the HPI. All pertinent positives and  negatives as reviewed in the HPI. ____________________________________________   PHYSICAL EXAM:  VITAL SIGNS: ED Triage Vitals  Enc Vitals Group     BP 02/04/19 2246 (!) 130/95     Pulse Rate 02/04/19 2246 87     Resp 02/04/19 2246 18     Temp 02/04/19 2246 98.7 F (37.1 C)     Temp Source 02/04/19 2246 Oral     SpO2 02/04/19 2246 100 %     Weight 02/04/19 2248 252 lb (114.3 kg)     Height 02/04/19 2248 6\' 3"  (1.905 m)     Head Circumference --      Peak Flow --      Pain Score 02/04/19 2248 6     Pain Loc --      Pain Edu? --      Excl. in [place]? --     Constitutional: Alert and oriented. Well appearing and in no acute distress. Eyes: Conjunctivae are normal. PERRL. EOMI. Head: Atraumatic. Nose: No congestion/rhinnorhea. Mouth/Throat: Mucous membranes are moist.  Oropharynx non-erythematous. Neck: No stridor.  No meningeal signs.   Cardiovascular: Normal rate, regular rhythm. Good peripheral circulation. Grossly normal heart sounds.   Respiratory: Normal respiratory effort.  No retractions. Lungs CTAB. Gastrointestinal: Soft and nontender. No distention.  Musculoskeletal: Tenderness to palpation over the fifth metatarsal.  Ankle stable and intact.  Mild swelling.  No proximal fibular tenderness. Neurologic:  Normal speech and language. No gross focal neurologic deficits are appreciated.  Skin:  Skin is warm, dry and intact. No rash noted.   ____________________________________________   LABS (all labs ordered are listed, but only abnormal results are displayed)  Labs Reviewed - No data to display ____________________________________________  EKG   ____________________________________________  RADIOLOGY  Dg Ankle Complete Left  Result Date: 02/04/2019 CLINICAL DATA:  Pain status post fall EXAM: LEFT ANKLE COMPLETE - 3+ VIEW COMPARISON:  09/17/2017 FINDINGS: The patient is status post prior ankle arthroplasty. The hardware appears grossly intact. There is some  mild soft tissue swelling about the ankle. There is no acute displaced fracture or dislocation. There is a moderate-sized plantar calcaneal spur. There are degenerative changes of the subtalar joint. IMPRESSION: No acute displaced fracture or dislocation. Electronically Signed   By: Katherine Mantlehristopher  Green M.D.   On: 02/04/2019 23:03    ____________________________________________   PROCEDURES  Procedure(s) performed:   Procedures   ____________________________________________   INITIAL IMPRESSION / ASSESSMENT AND PLAN / ED COURSE   53 yo M who presents to ED with L ankle pain and swelling after fall with inversion/eversion. On exam has pain with ROM, slight swelling, pain with palpation of malleolus. Difficulty bearing weight for more than a couple steps. So XR done to evaluate for fracture and negative. No proximal fibular ttp to suggest fracture or unstable injury. No laxity in ankle to suggest severe grade 3 sprain.  Plan for air splint, ace wrap. NSAIDs for pain. Will also employ RICE therapy and weight bearing as tolerated. If symptoms not improving in one week, will plan to follow up with PCP or orthopedics for repeat imaging to evaluate for occult fracture.   Pertinent labs & imaging results that were available during my care of the patient were reviewed by me and considered in my medical decision making (see chart for details).  A medical screening exam was performed and I feel the patient has had an appropriate workup for their chief complaint at this time and likelihood of emergent condition existing is low. They have been counseled on decision, discharge, follow up and which symptoms necessitate immediate return to the emergency department. They or their family verbally stated understanding and agreement with plan and discharged in stable condition.   ____________________________________________  FINAL CLINICAL IMPRESSION(S) / ED DIAGNOSES  Final diagnoses:  Acute left ankle pain      MEDICATIONS GIVEN DURING THIS VISIT:  Medications - No data to display   NEW OUTPATIENT MEDICATIONS STARTED DURING THIS VISIT:  Discharge Medication List as of 02/04/2019 11:21 PM      Note:  This note was prepared with assistance of Dragon voice recognition software. Occasional wrong-word or sound-a-like substitutions may have occurred due to the inherent limitations of voice recognition software.   Carmine Youngberg, Barbara CowerJason, MD 02/05/19 [id number]    Marily MemosMesner, Manuelito Poage, MD 02/05/19 [phone]

## 2019-03-02 DIAGNOSIS — F331 Major depressive disorder, recurrent, moderate: Secondary | ICD-10-CM | POA: Diagnosis not present

## 2019-03-08 DIAGNOSIS — F331 Major depressive disorder, recurrent, moderate: Secondary | ICD-10-CM | POA: Diagnosis not present

## 2019-03-30 DIAGNOSIS — F331 Major depressive disorder, recurrent, moderate: Secondary | ICD-10-CM | POA: Diagnosis not present

## 2019-04-25 DIAGNOSIS — F331 Major depressive disorder, recurrent, moderate: Secondary | ICD-10-CM | POA: Diagnosis not present

## 2019-05-23 DIAGNOSIS — F331 Major depressive disorder, recurrent, moderate: Secondary | ICD-10-CM | POA: Diagnosis not present

## 2019-06-15 DIAGNOSIS — J069 Acute upper respiratory infection, unspecified: Secondary | ICD-10-CM | POA: Diagnosis not present

## 2019-06-15 DIAGNOSIS — Z20828 Contact with and (suspected) exposure to other viral communicable diseases: Secondary | ICD-10-CM | POA: Diagnosis not present

## 2019-07-18 DIAGNOSIS — F331 Major depressive disorder, recurrent, moderate: Secondary | ICD-10-CM | POA: Diagnosis not present

## 2019-08-15 DIAGNOSIS — F331 Major depressive disorder, recurrent, moderate: Secondary | ICD-10-CM | POA: Diagnosis not present

## 2019-09-12 DIAGNOSIS — F331 Major depressive disorder, recurrent, moderate: Secondary | ICD-10-CM | POA: Diagnosis not present

## 2019-10-11 DIAGNOSIS — F331 Major depressive disorder, recurrent, moderate: Secondary | ICD-10-CM | POA: Diagnosis not present

## 2019-10-12 IMAGING — DX LEFT ANKLE COMPLETE - 3+ VIEW
3 series · 3 of 3 positions shown · non-contrast
Comparison: 09/17/2017

CLINICAL DATA: Pain status post fall

EXAM:
LEFT ANKLE COMPLETE - 3+ VIEW

[ankle ap]
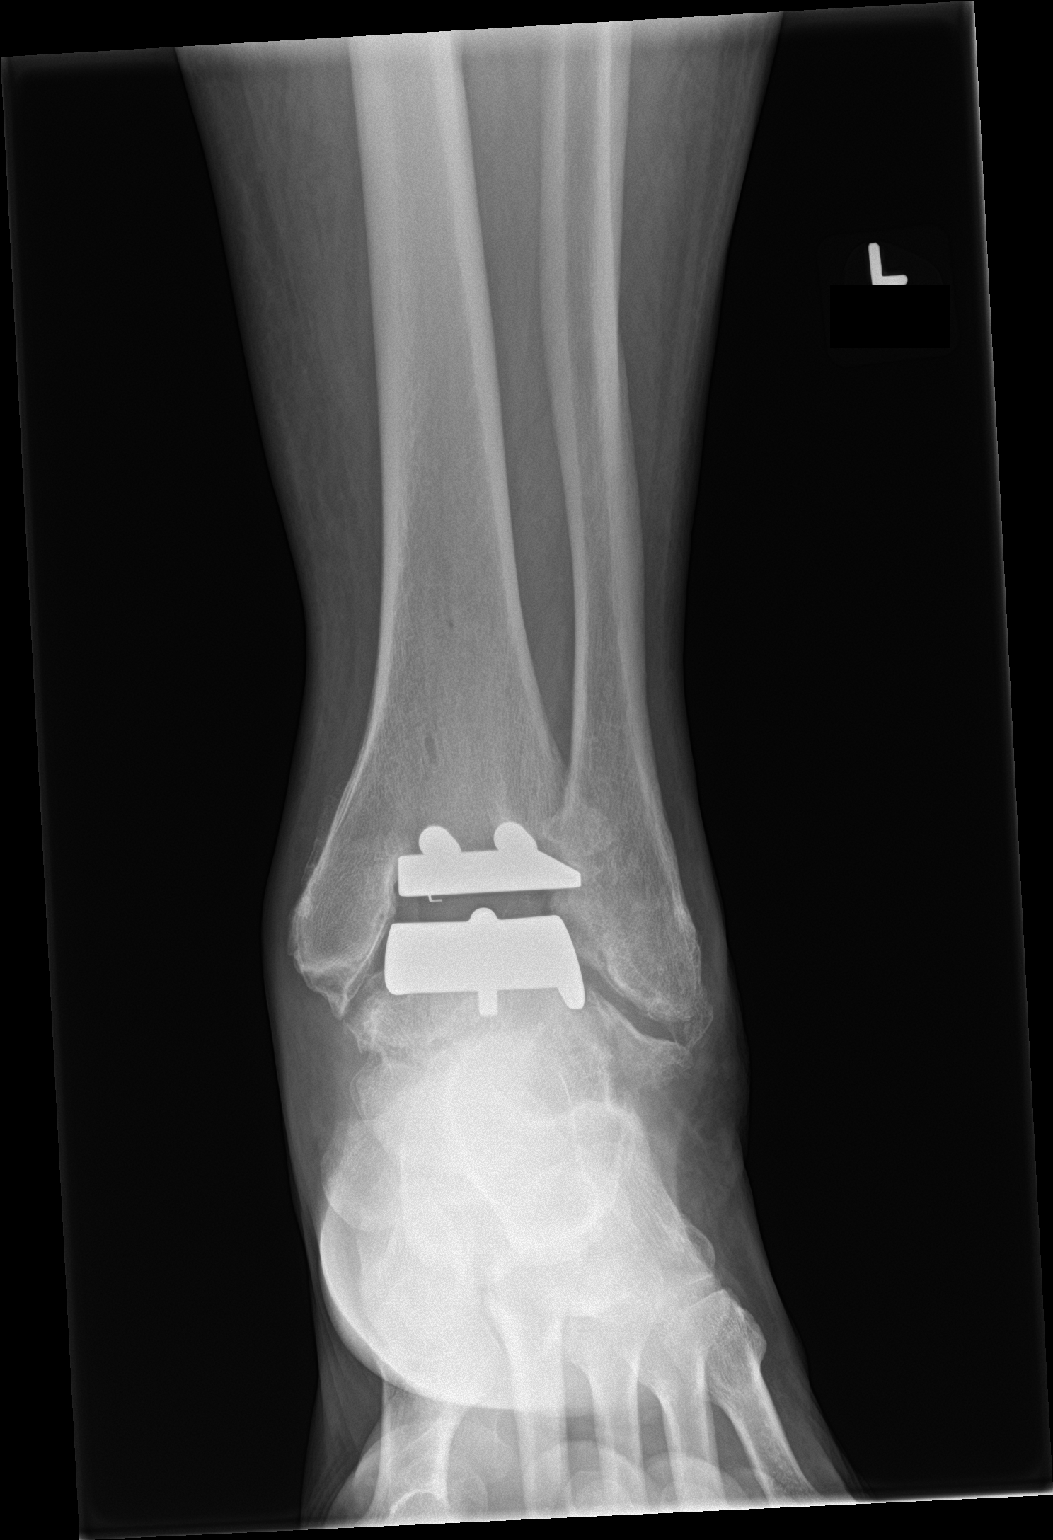

[ankle obl]
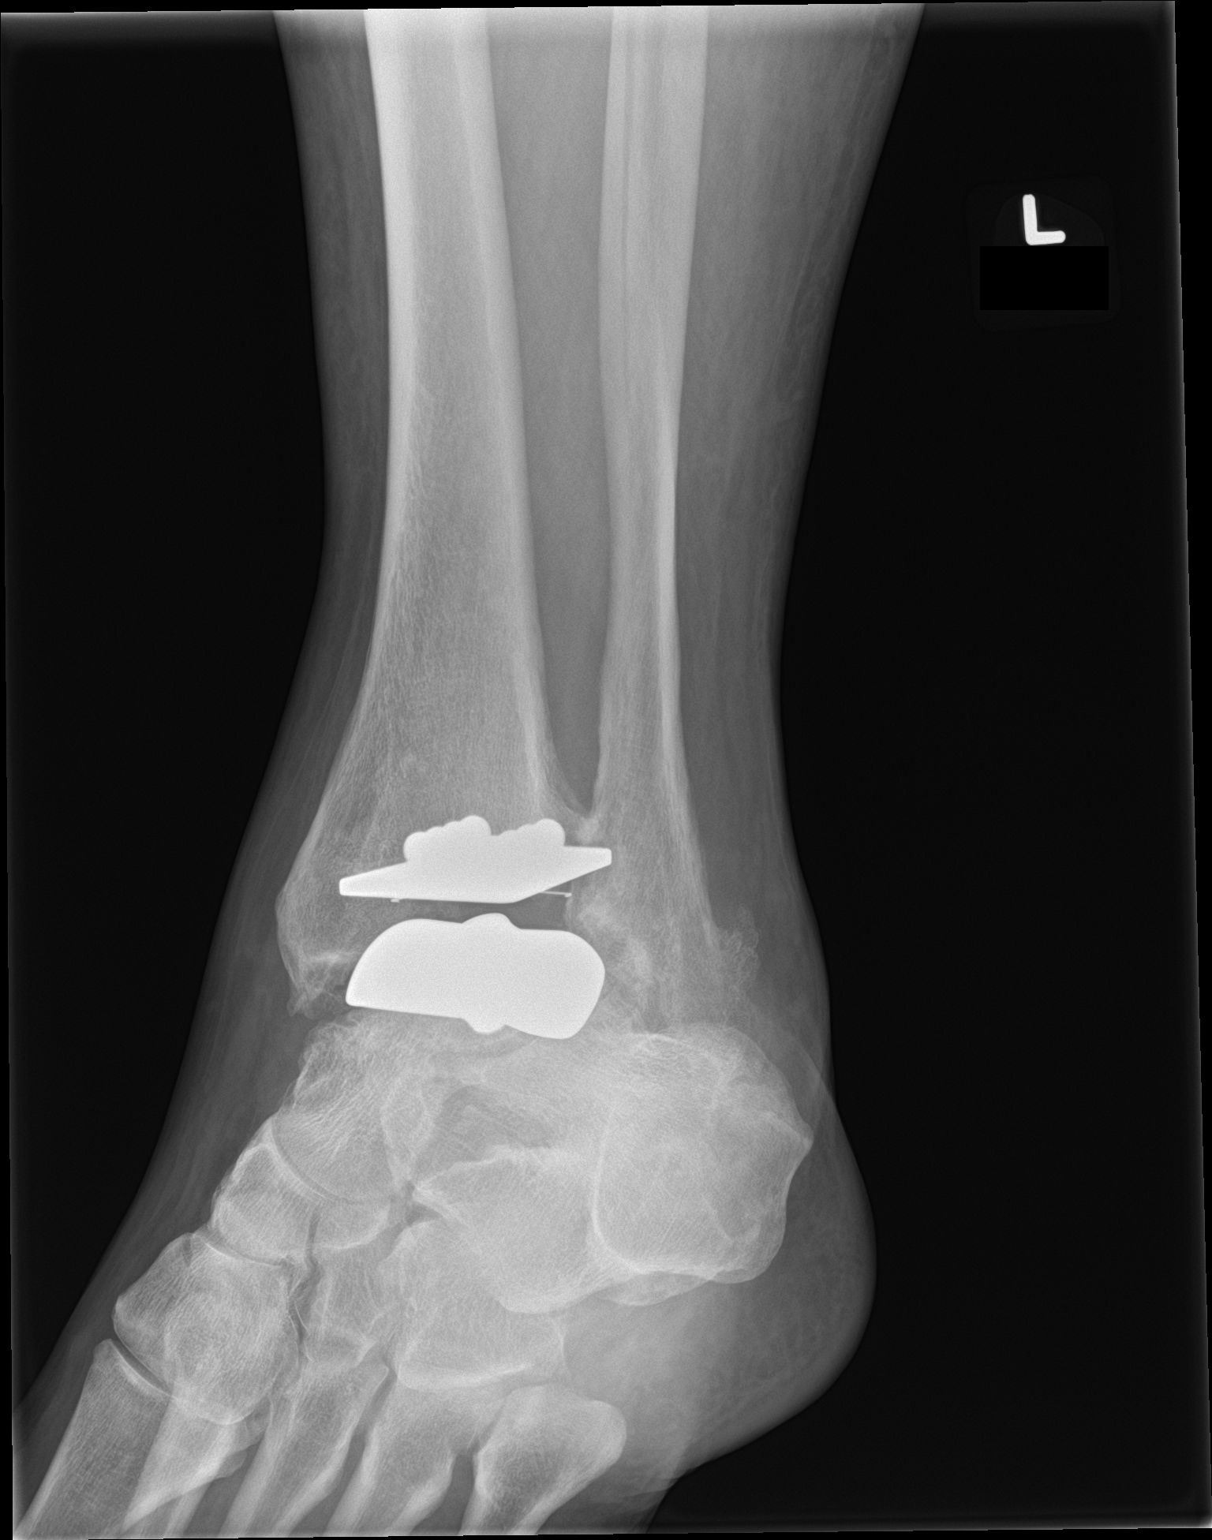

[ankle lat]
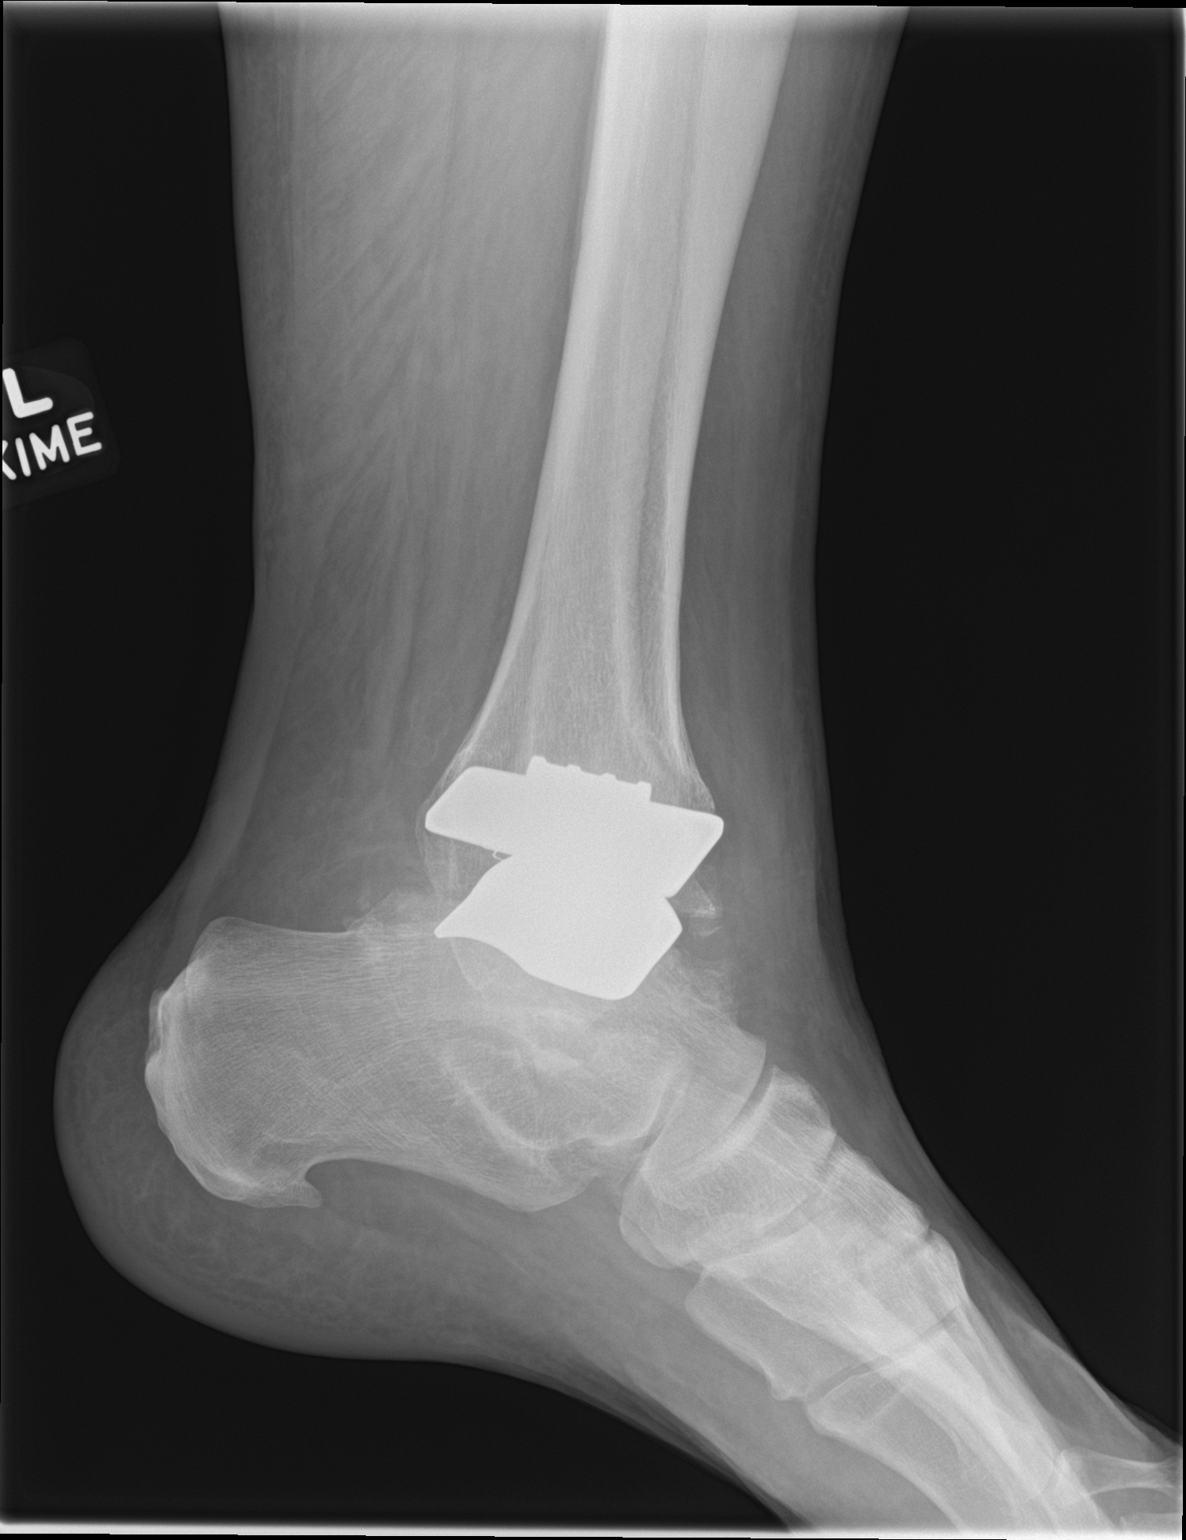

[3 of 3 positions shown; findings below may reference images not displayed]

FINDINGS: The patient is status post prior ankle arthroplasty. The hardware
appears grossly intact. There is some mild soft tissue swelling
about the ankle. There is no acute displaced fracture or
dislocation. There is a moderate-sized plantar calcaneal spur. There
are degenerative changes of the subtalar joint.
IMPRESSION: No acute displaced fracture or dislocation.

## 2019-10-31 DIAGNOSIS — I739 Peripheral vascular disease, unspecified: Secondary | ICD-10-CM | POA: Diagnosis not present

## 2019-11-03 DIAGNOSIS — I739 Peripheral vascular disease, unspecified: Secondary | ICD-10-CM | POA: Diagnosis not present

## 2019-11-03 DIAGNOSIS — I70213 Atherosclerosis of native arteries of extremities with intermittent claudication, bilateral legs: Secondary | ICD-10-CM | POA: Diagnosis not present

## 2019-11-07 DIAGNOSIS — F331 Major depressive disorder, recurrent, moderate: Secondary | ICD-10-CM | POA: Diagnosis not present

## 2019-11-18 DIAGNOSIS — Z0000 Encounter for general adult medical examination without abnormal findings: Secondary | ICD-10-CM | POA: Diagnosis not present

## 2019-11-18 DIAGNOSIS — Z6835 Body mass index (BMI) 35.0-35.9, adult: Secondary | ICD-10-CM | POA: Diagnosis not present

## 2019-11-18 DIAGNOSIS — Z125 Encounter for screening for malignant neoplasm of prostate: Secondary | ICD-10-CM | POA: Diagnosis not present

## 2019-11-18 DIAGNOSIS — Z23 Encounter for immunization: Secondary | ICD-10-CM | POA: Diagnosis not present

## 2019-11-18 DIAGNOSIS — Z13220 Encounter for screening for lipoid disorders: Secondary | ICD-10-CM | POA: Diagnosis not present

## 2020-04-25 DIAGNOSIS — F331 Major depressive disorder, recurrent, moderate: Secondary | ICD-10-CM | POA: Diagnosis not present

## 2020-05-01 DIAGNOSIS — F331 Major depressive disorder, recurrent, moderate: Secondary | ICD-10-CM | POA: Diagnosis not present

## 2020-05-30 DIAGNOSIS — F331 Major depressive disorder, recurrent, moderate: Secondary | ICD-10-CM | POA: Diagnosis not present

## 2020-06-27 DIAGNOSIS — F331 Major depressive disorder, recurrent, moderate: Secondary | ICD-10-CM | POA: Diagnosis not present

## 2020-06-29 DIAGNOSIS — J209 Acute bronchitis, unspecified: Secondary | ICD-10-CM | POA: Diagnosis not present

## 2020-06-29 DIAGNOSIS — Z20828 Contact with and (suspected) exposure to other viral communicable diseases: Secondary | ICD-10-CM | POA: Diagnosis not present

## 2020-09-25 DIAGNOSIS — F331 Major depressive disorder, recurrent, moderate: Secondary | ICD-10-CM | POA: Diagnosis not present

## 2020-10-23 DIAGNOSIS — J209 Acute bronchitis, unspecified: Secondary | ICD-10-CM | POA: Diagnosis not present

## 2020-10-23 DIAGNOSIS — Z20828 Contact with and (suspected) exposure to other viral communicable diseases: Secondary | ICD-10-CM | POA: Diagnosis not present

## 2020-12-26 DIAGNOSIS — F331 Major depressive disorder, recurrent, moderate: Secondary | ICD-10-CM | POA: Diagnosis not present

## 2021-02-14 DIAGNOSIS — J0100 Acute maxillary sinusitis, unspecified: Secondary | ICD-10-CM | POA: Diagnosis not present

## 2021-02-14 DIAGNOSIS — J209 Acute bronchitis, unspecified: Secondary | ICD-10-CM | POA: Diagnosis not present

## 2021-02-21 DIAGNOSIS — F331 Major depressive disorder, recurrent, moderate: Secondary | ICD-10-CM | POA: Diagnosis not present

## 2021-03-21 DIAGNOSIS — F331 Major depressive disorder, recurrent, moderate: Secondary | ICD-10-CM | POA: Diagnosis not present

## 2021-03-26 DIAGNOSIS — J209 Acute bronchitis, unspecified: Secondary | ICD-10-CM | POA: Diagnosis not present

## 2021-03-26 DIAGNOSIS — J0100 Acute maxillary sinusitis, unspecified: Secondary | ICD-10-CM | POA: Diagnosis not present

## 2021-04-02 DIAGNOSIS — F331 Major depressive disorder, recurrent, moderate: Secondary | ICD-10-CM | POA: Diagnosis not present

## 2021-05-01 DIAGNOSIS — K625 Hemorrhage of anus and rectum: Secondary | ICD-10-CM | POA: Diagnosis not present

## 2021-05-01 DIAGNOSIS — K429 Umbilical hernia without obstruction or gangrene: Secondary | ICD-10-CM | POA: Diagnosis not present

## 2021-07-01 DIAGNOSIS — R6882 Decreased libido: Secondary | ICD-10-CM | POA: Diagnosis not present

## 2021-07-02 DIAGNOSIS — F331 Major depressive disorder, recurrent, moderate: Secondary | ICD-10-CM | POA: Diagnosis not present

## 2021-07-16 DIAGNOSIS — F331 Major depressive disorder, recurrent, moderate: Secondary | ICD-10-CM | POA: Diagnosis not present

## 2021-10-01 DIAGNOSIS — F331 Major depressive disorder, recurrent, moderate: Secondary | ICD-10-CM | POA: Diagnosis not present

## 2022-01-07 DIAGNOSIS — F331 Major depressive disorder, recurrent, moderate: Secondary | ICD-10-CM | POA: Diagnosis not present

## 2022-02-21 DIAGNOSIS — K429 Umbilical hernia without obstruction or gangrene: Secondary | ICD-10-CM | POA: Diagnosis not present

## 2022-03-04 DIAGNOSIS — K420 Umbilical hernia with obstruction, without gangrene: Secondary | ICD-10-CM | POA: Diagnosis not present

## 2022-04-01 DIAGNOSIS — K420 Umbilical hernia with obstruction, without gangrene: Secondary | ICD-10-CM | POA: Diagnosis not present

## 2022-04-07 DIAGNOSIS — K668 Other specified disorders of peritoneum: Secondary | ICD-10-CM | POA: Diagnosis not present

## 2022-04-07 DIAGNOSIS — K420 Umbilical hernia with obstruction, without gangrene: Secondary | ICD-10-CM | POA: Diagnosis not present

## 2022-04-08 DIAGNOSIS — F331 Major depressive disorder, recurrent, moderate: Secondary | ICD-10-CM | POA: Diagnosis not present

## 2023-01-12 DIAGNOSIS — F4320 Adjustment disorder, unspecified: Secondary | ICD-10-CM | POA: Diagnosis not present

## 2023-01-27 DIAGNOSIS — F4321 Adjustment disorder with depressed mood: Secondary | ICD-10-CM | POA: Diagnosis not present

## 2023-01-27 DIAGNOSIS — F4320 Adjustment disorder, unspecified: Secondary | ICD-10-CM | POA: Diagnosis not present

## 2023-01-27 DIAGNOSIS — F330 Major depressive disorder, recurrent, mild: Secondary | ICD-10-CM | POA: Diagnosis not present

## 2023-02-10 DIAGNOSIS — F4320 Adjustment disorder, unspecified: Secondary | ICD-10-CM | POA: Diagnosis not present

## 2023-02-24 DIAGNOSIS — F4320 Adjustment disorder, unspecified: Secondary | ICD-10-CM | POA: Diagnosis not present

## 2023-03-10 DIAGNOSIS — F4320 Adjustment disorder, unspecified: Secondary | ICD-10-CM | POA: Diagnosis not present

## 2023-03-17 DIAGNOSIS — F330 Major depressive disorder, recurrent, mild: Secondary | ICD-10-CM | POA: Diagnosis not present

## 2023-03-24 DIAGNOSIS — F4320 Adjustment disorder, unspecified: Secondary | ICD-10-CM | POA: Diagnosis not present

## 2023-04-06 DIAGNOSIS — F4320 Adjustment disorder, unspecified: Secondary | ICD-10-CM | POA: Diagnosis not present

## 2023-05-05 DIAGNOSIS — F4320 Adjustment disorder, unspecified: Secondary | ICD-10-CM | POA: Diagnosis not present

## 2023-05-18 DIAGNOSIS — S63392A Traumatic rupture of other ligament of left wrist, initial encounter: Secondary | ICD-10-CM | POA: Diagnosis not present

## 2023-05-18 DIAGNOSIS — S63592A Other specified sprain of left wrist, initial encounter: Secondary | ICD-10-CM | POA: Diagnosis not present

## 2023-05-19 DIAGNOSIS — F4320 Adjustment disorder, unspecified: Secondary | ICD-10-CM | POA: Diagnosis not present

## 2023-06-02 DIAGNOSIS — F4320 Adjustment disorder, unspecified: Secondary | ICD-10-CM | POA: Diagnosis not present

## 2023-06-16 DIAGNOSIS — F330 Major depressive disorder, recurrent, mild: Secondary | ICD-10-CM | POA: Diagnosis not present

## 2023-08-11 ENCOUNTER — Encounter (HOSPITAL_BASED_OUTPATIENT_CLINIC_OR_DEPARTMENT_OTHER): Payer: Self-pay | Admitting: Orthopedic Surgery

## 2023-08-11 ENCOUNTER — Other Ambulatory Visit: Payer: Self-pay

## 2023-08-19 ENCOUNTER — Ambulatory Visit (HOSPITAL_BASED_OUTPATIENT_CLINIC_OR_DEPARTMENT_OTHER): Payer: Worker's Compensation

## 2023-08-19 ENCOUNTER — Ambulatory Visit (HOSPITAL_BASED_OUTPATIENT_CLINIC_OR_DEPARTMENT_OTHER): Payer: Worker's Compensation | Admitting: Anesthesiology

## 2023-08-19 ENCOUNTER — Ambulatory Visit (HOSPITAL_BASED_OUTPATIENT_CLINIC_OR_DEPARTMENT_OTHER)
Admission: RE | Admit: 2023-08-19 | Discharge: 2023-08-19 | Disposition: A | Discharge status: Home / Self Care | Payer: Worker's Compensation | Attending: Orthopedic Surgery | Admitting: Orthopedic Surgery

## 2023-08-19 ENCOUNTER — Other Ambulatory Visit: Payer: Self-pay

## 2023-08-19 ENCOUNTER — Encounter (HOSPITAL_BASED_OUTPATIENT_CLINIC_OR_DEPARTMENT_OTHER)
Admission: RE | Disposition: A | Discharge status: Home / Self Care | Payer: Self-pay | Source: Home / Self Care | Attending: Orthopedic Surgery

## 2023-08-19 ENCOUNTER — Encounter (HOSPITAL_BASED_OUTPATIENT_CLINIC_OR_DEPARTMENT_OTHER): Payer: Self-pay | Admitting: Orthopedic Surgery

## 2023-08-19 DIAGNOSIS — M65832 Other synovitis and tenosynovitis, left forearm: Secondary | ICD-10-CM | POA: Diagnosis not present

## 2023-08-19 DIAGNOSIS — F419 Anxiety disorder, unspecified: Secondary | ICD-10-CM | POA: Diagnosis not present

## 2023-08-19 DIAGNOSIS — Z87891 Personal history of nicotine dependence: Secondary | ICD-10-CM | POA: Insufficient documentation

## 2023-08-19 DIAGNOSIS — X58XXXA Exposure to other specified factors, initial encounter: Secondary | ICD-10-CM | POA: Insufficient documentation

## 2023-08-19 DIAGNOSIS — F32A Depression, unspecified: Secondary | ICD-10-CM | POA: Insufficient documentation

## 2023-08-19 DIAGNOSIS — M24132 Other articular cartilage disorders, left wrist: Secondary | ICD-10-CM | POA: Diagnosis not present

## 2023-08-19 DIAGNOSIS — S63512A Sprain of carpal joint of left wrist, initial encounter: Secondary | ICD-10-CM

## 2023-08-19 DIAGNOSIS — S63392A Traumatic rupture of other ligament of left wrist, initial encounter: Secondary | ICD-10-CM | POA: Insufficient documentation

## 2023-08-19 DIAGNOSIS — Z01818 Encounter for other preprocedural examination: Secondary | ICD-10-CM

## 2023-08-19 DIAGNOSIS — M1990 Unspecified osteoarthritis, unspecified site: Secondary | ICD-10-CM | POA: Insufficient documentation

## 2023-08-19 HISTORY — DX: Depression, unspecified: F32.A

## 2023-08-19 HISTORY — DX: Anxiety disorder, unspecified: F41.9

## 2023-08-19 HISTORY — PX: WRIST ARTHROSCOPY WITH DEBRIDEMENT: SHX6194

## 2023-08-19 SURGERY — WRIST ARTHROSCOPY WITH DEBRIDEMENT
Anesthesia: Monitor Anesthesia Care | Site: Wrist | Laterality: Left

## 2023-08-19 MED ORDER — LACTATED RINGERS IV SOLN: INTRAVENOUS | Status: DC

## 2023-08-19 MED ORDER — CEFAZOLIN SODIUM-DEXTROSE 1-4 GM/50ML-% IV SOLN
INTRAVENOUS | Status: AC
Start: 2023-08-19 — End: ?
  Filled 2023-08-19: qty 50

## 2023-08-19 MED ORDER — ONDANSETRON HCL 4 MG/2ML IJ SOLN: 4.0000 mg | Freq: Once | INTRAMUSCULAR | Status: DC | PRN

## 2023-08-19 MED ORDER — FENTANYL CITRATE (PF) 100 MCG/2ML IJ SOLN
INTRAMUSCULAR | Status: AC
  Filled 2023-08-19: qty 2

## 2023-08-19 MED ORDER — HYDROMORPHONE HCL 1 MG/ML IJ SOLN: 0.2500 mg | INTRAMUSCULAR | Status: DC | PRN

## 2023-08-19 MED ORDER — MIDAZOLAM HCL 2 MG/2ML IJ SOLN
2.0000 mg | Freq: Once | INTRAMUSCULAR | Status: AC
  Administered 2023-08-19: 2 mg via INTRAVENOUS

## 2023-08-19 MED ORDER — OXYCODONE HCL 5 MG PO TABS: 5.0000 mg | ORAL_TABLET | Freq: Four times a day (QID) | ORAL | 0 refills | Status: AC | PRN

## 2023-08-19 MED ORDER — OXYCODONE HCL 5 MG PO TABS: 5.0000 mg | ORAL_TABLET | Freq: Once | ORAL | Status: DC | PRN

## 2023-08-19 MED ORDER — MIDAZOLAM HCL 2 MG/2ML IJ SOLN
INTRAMUSCULAR | Status: AC
  Filled 2023-08-19: qty 2

## 2023-08-19 MED ORDER — SODIUM CHLORIDE 0.9 % IV SOLN: INTRAVENOUS | Status: DC | PRN

## 2023-08-19 MED ORDER — 0.9 % SODIUM CHLORIDE (POUR BTL) OPTIME
TOPICAL | Status: DC | PRN
  Administered 2023-08-19: 1000 mL

## 2023-08-19 MED ORDER — ONDANSETRON HCL 4 MG/2ML IJ SOLN
INTRAMUSCULAR | Status: DC | PRN
  Administered 2023-08-19: 4 mg via INTRAVENOUS

## 2023-08-19 MED ORDER — OXYCODONE HCL 5 MG/5ML PO SOLN
5.0000 mg | Freq: Once | ORAL | Status: DC | PRN
Start: 2023-08-19 — End: 2023-08-19

## 2023-08-19 MED ORDER — ROPIVACAINE HCL 5 MG/ML IJ SOLN
INTRAMUSCULAR | Status: DC | PRN
  Administered 2023-08-19: 30 mL via PERINEURAL

## 2023-08-19 MED ORDER — DEXAMETHASONE SODIUM PHOSPHATE 10 MG/ML IJ SOLN
INTRAMUSCULAR | Status: DC | PRN
  Administered 2023-08-19: 10 mg

## 2023-08-19 MED ORDER — SODIUM CHLORIDE 0.9 % IV SOLN
3.0000 g | INTRAVENOUS | Status: AC
  Administered 2023-08-19: 3 g via INTRAVENOUS

## 2023-08-19 MED ORDER — CEFAZOLIN SODIUM-DEXTROSE 2-4 GM/100ML-% IV SOLN
INTRAVENOUS | Status: AC
  Filled 2023-08-19: qty 100

## 2023-08-19 MED ORDER — AMISULPRIDE (ANTIEMETIC) 5 MG/2ML IV SOLN: 10.0000 mg | Freq: Once | INTRAVENOUS | Status: DC | PRN

## 2023-08-19 MED ORDER — FENTANYL CITRATE (PF) 100 MCG/2ML IJ SOLN
100.0000 ug | Freq: Once | INTRAMUSCULAR | Status: AC
  Administered 2023-08-19: 100 ug via INTRAVENOUS

## 2023-08-19 MED ORDER — PROPOFOL 500 MG/50ML IV EMUL
INTRAVENOUS | Status: DC | PRN
  Administered 2023-08-19: 100 ug/kg/min via INTRAVENOUS

## 2023-08-19 SURGICAL SUPPLY — 63 items
BLADE MINI RND TIP GREEN BEAV (BLADE) IMPLANT
BLADE SURG 11 STRL SS (BLADE) ×1 IMPLANT
BLADE SURG 15 STRL LF DISP TIS (BLADE) ×2 IMPLANT
BNDG ELASTIC 3INX 5YD STR LF (GAUZE/BANDAGES/DRESSINGS) ×2 IMPLANT
BNDG ESMARK 4X9 LF (GAUZE/BANDAGES/DRESSINGS) ×2 IMPLANT
BNDG GAUZE DERMACEA FLUFF 4 (GAUZE/BANDAGES/DRESSINGS) ×2 IMPLANT
BNDG PLASTER X FAST 3X3 WHT LF (CAST SUPPLIES) ×20 IMPLANT
BNDG PLASTER X FAST 4X5 WHT LF (CAST SUPPLIES) ×20 IMPLANT
BURR SM HUB 3.0 OVAL (BLADE) IMPLANT
CANISTER SUCT 1200ML W/VALVE (MISCELLANEOUS) IMPLANT
CHLORAPREP W/TINT 26 (MISCELLANEOUS) ×2 IMPLANT
CORD BIPOLAR FORCEPS 12FT (ELECTRODE) ×2 IMPLANT
COVER BACK TABLE 60X90IN (DRAPES) ×2 IMPLANT
COVER MAYO STAND STRL (DRAPES) ×2 IMPLANT
CUFF TOURN SGL QUICK 18X4 (TOURNIQUET CUFF) IMPLANT
CUFF TRNQT CYL 24X4X16.5-23 (TOURNIQUET CUFF) ×1 IMPLANT
DRAPE EXTREMITY T 121X128X90 (DISPOSABLE) ×2 IMPLANT
DRAPE IMP U-DRAPE 54X76 (DRAPES) ×1 IMPLANT
DRAPE OEC MINIVIEW 54X84 (DRAPES) ×2 IMPLANT
DRAPE SURG 17X23 STRL (DRAPES) ×2 IMPLANT
DRAPE U-SHAPE 47X51 STRL (DRAPES) ×1 IMPLANT
GAUZE SPONGE 4X4 12PLY STRL (GAUZE/BANDAGES/DRESSINGS) IMPLANT
GAUZE XEROFORM 1X8 LF (GAUZE/BANDAGES/DRESSINGS) ×2 IMPLANT
GLOVE BIO SURGEON STRL SZ7 (GLOVE) ×3 IMPLANT
GLOVE BIOGEL PI IND STRL 7.0 (GLOVE) ×2 IMPLANT
GOWN STRL REUS W/ TWL LRG LVL3 (GOWN DISPOSABLE) ×4 IMPLANT
GOWN STRL REUS W/ TWL XL LVL3 (GOWN DISPOSABLE) ×2 IMPLANT
IV NS IRRIG 3000ML ARTHROMATIC (IV SOLUTION) ×2 IMPLANT
MANIFOLD NEPTUNE II (INSTRUMENTS) ×2 IMPLANT
NDL HYPO 22X1.5 SAFETY MO (MISCELLANEOUS) IMPLANT
NDL HYPO 25X1 1.5 SAFETY (NEEDLE) IMPLANT
NDL SAFETY ECLIPSE 18X1.5 (NEEDLE) ×2 IMPLANT
NEEDLE HYPO 22X1.5 SAFETY MO (MISCELLANEOUS) IMPLANT
NEEDLE HYPO 25X1 1.5 SAFETY (NEEDLE) ×2 IMPLANT
NS IRRIG 1000ML POUR BTL (IV SOLUTION) ×2 IMPLANT
PACK BASIN DAY SURGERY FS (CUSTOM PROCEDURE TRAY) ×2 IMPLANT
PAD CAST 3X4 CTTN HI CHSV (CAST SUPPLIES) ×2 IMPLANT
PADDING CAST ABS COTTON 3X4 (CAST SUPPLIES) ×2 IMPLANT
PADDING CAST ABS COTTON 4X4 ST (CAST SUPPLIES) ×2 IMPLANT
SET SM JOINT TUBING/CANN (CANNULA) ×2 IMPLANT
SHAVER DISSECTOR 3.0 (BURR) IMPLANT
SHAVER SABRE 2.0 (BURR) ×1 IMPLANT
SLEEVE SCD COMPRESS KNEE MED (STOCKING) ×2 IMPLANT
SLING ARM FOAM STRAP LRG (SOFTGOODS) ×1 IMPLANT
SPLINT FIBERGLASS 4X30 (CAST SUPPLIES) ×2 IMPLANT
SPLINT PLASTER CAST XFAST 3X15 (CAST SUPPLIES) IMPLANT
STRAP SET WRIST TOWER ACUMED (MISCELLANEOUS) ×2 IMPLANT
SUCTION TUBE FRAZIER 10FR DISP (SUCTIONS) IMPLANT
SUT ETHIBOND 3-0 V-5 (SUTURE) IMPLANT
SUT ETHILON 4 0 PS 2 18 (SUTURE) ×2 IMPLANT
SUT MERSILENE 4 0 P 3 (SUTURE) IMPLANT
SUT MNCRL AB 3-0 PS2 18 (SUTURE) IMPLANT
SUT MNCRL AB 4-0 PS2 18 (SUTURE) ×2 IMPLANT
SUT PDS AB 2-0 CT2 27 (SUTURE) IMPLANT
SUT VIC AB 2-0 PS2 27 (SUTURE) IMPLANT
SUT VIC AB 4-0 PS2 18 (SUTURE) IMPLANT
SYR BULB EAR ULCER 3OZ GRN STR (SYRINGE) ×2 IMPLANT
SYR CONTROL 10ML LL (SYRINGE) IMPLANT
TOWEL GREEN STERILE FF (TOWEL DISPOSABLE) ×4 IMPLANT
TUBE CONNECTING 20X1/4 (TUBING) ×2 IMPLANT
TUBING ARTHROSCOPY IRRIG 16FT (MISCELLANEOUS) IMPLANT
UNDERPAD 30X36 HEAVY ABSORB (UNDERPADS AND DIAPERS) ×2 IMPLANT
WAND 1.5 MICROBLATOR (SURGICAL WAND) IMPLANT

## 2023-08-19 NOTE — Anesthesia Procedure Notes (Signed)
Anesthesia Regional Block: Supraclavicular block   Pre-Anesthetic Checklist: , timeout performed,  Correct Patient, Correct Site, Correct Laterality,  Correct Procedure, Correct Position, site marked,  Risks and benefits discussed,  Surgical consent,  Pre-op evaluation,  At surgeon's request and post-op pain management  Laterality: Left  Prep: Maximum Sterile Barrier Precautions used, chloraprep       Needles:  Injection technique: Single-shot  Needle Type: Echogenic Stimulator Needle     Needle Length: 9cm  Needle Gauge: 22     Additional Needles:   Procedures:,,,, ultrasound used (permanent image in chart),,    Narrative:  Start time: 08/19/2023 7:20 AM End time: 08/19/2023 7:25 AM Injection made incrementally with aspirations every 5 mL.  Performed by: Personally  Anesthesiologist: Lannie Fields, DO  Additional Notes: Monitors applied. No increased pain on injection. No increased resistance to injection. Injection made in 5cc increments. Good needle visualization. Patient tolerated procedure well.

## 2023-08-19 NOTE — H&P (Signed)
HAND SURGERY   HPI: Patient is a 58 y.o. male who presents with left wrist pain with MRI suggesting at least partial tear of dorsal band of SL ligament.  He has failed extensive conservative management with bracing, activity modification, corticosteroid injection, and NSAID use.  Patient denies any changes to their medical history or new systemic symptoms today.    Past Medical History:  Diagnosis Date   Anxiety    Arthritis    Arthritis of ankle    Post Traumatic, left   Depression    GERD (gastroesophageal reflux disease)    TAKES OTC   Past Surgical History:  Procedure Laterality Date   APPENDECTOMY     EYE SURGERY     LASER EYE & MUSCLE CORRECTION   HERNIA REPAIR     UMBILICAL & INGUINAL   TOTAL ANKLE ARTHROPLASTY Left 09/17/2017   Procedure: LEFT TOTAL ANKLE ARTHOPLASTY;  Surgeon: Toni Arthurs, MD;  Location: [facility] OR;  Service: Orthopedics;  Laterality: Left;   VASECTOMY     Social History   Socioeconomic History   Marital status: Married    Spouse name: Not on file   Number of children: Not on file   Years of education: Not on file   Highest education level: Not on file  Occupational History   Not on file  Tobacco Use   Smoking status: Former    Types: Cigarettes   Smokeless tobacco: Never  Vaping Use   Vaping status: Never Used  Substance and Sexual Activity   Alcohol use: Yes    Alcohol/week: 4.0 standard drinks of alcohol    Types: 4 Cans of beer per week    Comment: OCCASIONALLY   Drug use: No   Sexual activity: Yes  Other Topics Concern   Not on file  Social History Narrative   Not on file   Social Drivers of Health   Financial Resource Strain: Not on file  Food Insecurity: Not on file  Transportation Needs: Not on file  Physical Activity: Not on file  Stress: Not on file  Social Connections: Not on file   History reviewed. No pertinent family history. - negative except otherwise stated in the family history section No Known  Allergies Prior to Admission medications   Medication Sig Start Date End Date Taking? Authorizing Provider  acetaminophen (TYLENOL) 500 MG tablet Take 2 tablets (1,000 mg total) by mouth daily as needed for moderate pain or headache. 09/18/17  Yes Jacinta Shoe, PA-C  buPROPion (WELLBUTRIN XL) 300 MG 24 hr tablet Take 300 mg by mouth daily.   Yes [provider]  ibuprofen (ADVIL,MOTRIN) 200 MG tablet Take 400 mg by mouth daily as needed for headache or moderate pain.   Yes [provider]  Multiple Vitamin (MULTIVITAMIN WITH MINERALS) TABS tablet Take 1 tablet by mouth daily.   Yes [provider]   No results found. - Positive ROS: All other systems have been reviewed and were otherwise negative with the exception of those mentioned in the HPI and as above.  Physical Exam: General: No acute distress, resting comfortably Cardiovascular: BUE warm and well perfused, normal rate Respiratory: Normal WOB on RA Skin: Warm and dry Neurologic: Sensation intact distally Psychiatric: Patient is at baseline mood and affect  Left Upper Extremity  Mild swelling of wrist compared to contralateral side.  Remains TTP at the dorsal central aspect of the wrist at the SL interval.  Limited AROM of wrist compared to the contralateral side secondary to  pain.  Full and painless AROM of fingers.  SILT m/u/r distribution.  Hand warm and well perfused w/ BCR.     Assessment: 58 year old M w/ left scapholunate ligament injury that has failed conservative management.   Plan: OR today for left wrist arthroscopy with possible debridement versus open scapholunate ligament repair/reconstruction. We again reviewed the risks of surgery which include bleeding, infection, damage to neurovascular structures, persistent symptoms, wrist stiffness, delayed wound healing, need for additional surgery.  Informed consent was signed.  All questions were answered.   Marlyne Beards,  M.D. [facility] 7:21 AM

## 2023-08-19 NOTE — Discharge Instructions (Addendum)
Waylan Rocher, M.D. Hand Surgery  POST-OPERATIVE DISCHARGE INSTRUCTIONS   PRESCRIPTIONS: You may have been given a prescription to be taken as directed for post-operative pain control.  You may also take over the counter ibuprofen/aleve and tylenol for pain. Take this as directed on the packaging. Do not exceed 3000 mg tylenol/acetaminophen in 24 hours.  Ibuprofen 600-800 mg (3-4) tablets by mouth every 6 hours as needed for pain.  OR Aleve 2 tablets by mouth every 12 hours (twice daily) as needed for pain.  AND/OR Tylenol 1000 mg (2 tablets) every 8 hours as needed for pain.  Please use your pain medication carefully, as refills are limited and you may not be provided with one.  As stated above, please use over the counter pain medicine - it will also be helpful with decreasing your swelling.    ANESTHESIA: After your surgery, post-surgical discomfort or pain is likely. This discomfort can last several days to a few weeks. At certain times of the day your discomfort may be more intense.   Did you receive a nerve block?  A nerve block can provide pain relief for one hour to two days after your surgery. As long as the nerve block is working, you will experience little or no sensation in the area the surgeon operated on.  As the nerve block wears off, you will begin to experience pain or discomfort. It is very important that you begin taking your prescribed pain medication before the nerve block fully wears off. Treating your pain at the first sign of the block wearing off will ensure your pain is better controlled and more tolerable when full-sensation returns. Do not wait until the pain is intolerable, as the medicine will be less effective. It is better to treat pain in advance than to try and catch up.   General Anesthesia:  If you did not receive a nerve block during your surgery, you will need to start taking your pain medication shortly after your surgery and should continue  to do so as prescribed by your surgeon.     ICE AND ELEVATION: You may use ice for the first 48-72 hours, but it is not critical.   Motion of your fingers is very important to decrease the swelling.  Elevation, as much as possible for the next 48 hours, is critical for decreasing swelling as well as for pain relief. Elevation means when you are seated or lying down, you hand should be at or above your heart. When walking, the hand needs to be at or above the level of your elbow.  If the bandage gets too tight, it may need to be loosened. Please contact our office and we will instruct you in how to do this.    SURGICAL BANDAGES:  Keep your dressing and/or splint clean and dry at all times.  Do not remove until you are seen again in the office.  If careful, you may place a plastic bag over your bandage and tape the end to shower, but be careful, do not get your bandages wet.     HAND THERAPY:  You may not need any. If you do, we will begin this at your follow up visit in the clinic.    ACTIVITY AND WORK: You are encouraged to move any fingers which are not in the bandage.  Light use of the fingers is allowed to assist the other hand with daily hygiene and eating, but strong gripping or lifting is often uncomfortable and  should be avoided.  You might miss a variable period of time from work and hopefully this issue has been discussed prior to surgery. You may not do any heavy work with your affected hand for about 2 weeks.    [facility] Second Floor, [address] [place], [place] [address] [phone]  Post Anesthesia Home Care Instructions  Activity: Get plenty of rest for the remainder of the day. A responsible individual must stay with you for 24 hours following the procedure.  For the next 24 hours, DO NOT: -Drive a car -Advertising copywriter -Drink alcoholic beverages -Take any medication unless instructed by your physician -Make any legal decisions or sign important  papers.  Meals: Start with liquid foods such as gelatin or soup. Progress to regular foods as tolerated. Avoid greasy, spicy, heavy foods. If nausea and/or vomiting occur, drink only clear liquids until the nausea and/or vomiting subsides. Call your physician if vomiting continues.  Special Instructions/Symptoms: Your throat may feel dry or sore from the anesthesia or the breathing tube placed in your throat during surgery. If this causes discomfort, gargle with warm salt water. The discomfort should disappear within 24 hours.  If you had a scopolamine patch placed behind your ear for the management of post- operative nausea and/or vomiting:  1. The medication in the patch is effective for 72 hours, after which it should be removed.  Wrap patch in a tissue and discard in the trash. Wash hands thoroughly with soap and water. 2. You may remove the patch earlier than 72 hours if you experience unpleasant side effects which may include dry mouth, dizziness or visual disturbances. 3. Avoid touching the patch. Wash your hands with soap and water after contact with the patch.       Regional Anesthesia Blocks  1. You may not be able to move or feel the "blocked" extremity after a regional anesthetic block. This may last may last from 3-48 hours after placement, but it will go away. The length of time depends on the medication injected and your individual response to the medication. As the nerves start to wake up, you may experience tingling as the movement and feeling returns to your extremity. If the numbness and inability to move your extremity has not gone away after 48 hours, please call your surgeon.   2. The extremity that is blocked will need to be protected until the numbness is gone and the strength has returned. Because you cannot feel it, you will need to take extra care to avoid injury. Because it may be weak, you may have difficulty moving it or using it. You may not know what position it  is in without looking at it while the block is in effect.  3. For blocks in the legs and feet, returning to weight bearing and walking needs to be done carefully. You will need to wait until the numbness is entirely gone and the strength has returned. You should be able to move your leg and foot normally before you try and bear weight or walk. You will need someone to be with you when you first try to ensure you do not fall and possibly risk injury.  4. Bruising and tenderness at the needle site are common side effects and will resolve in a few days.  5. Persistent numbness or new problems with movement should be communicated to the surgeon or the [facility] [phone] [facility] [phone]).

## 2023-08-19 NOTE — Anesthesia Preprocedure Evaluation (Addendum)
Anesthesia Evaluation  Patient identified by MRN, date of birth, ID band Patient awake    Reviewed: Allergy & Precautions, NPO status , Patient's Chart, lab work & pertinent test results  Airway Mallampati: III  TM Distance: >3 FB Neck ROM: Full    Dental  (+) Teeth Intact, Dental Advisory Given   Pulmonary former smoker   Pulmonary exam normal breath sounds clear to auscultation       Cardiovascular negative cardio ROS Normal cardiovascular exam Rhythm:Regular Rate:Normal     Neuro/Psych  PSYCHIATRIC DISORDERS Anxiety Depression    negative neurological ROS     GI/Hepatic Neg liver ROS,GERD  Controlled,,  Endo/Other  BMI 34  Renal/GU negative Renal ROS  negative genitourinary   Musculoskeletal  (+) Arthritis , Osteoarthritis,    Abdominal   Peds  Hematology negative hematology ROS (+)   Anesthesia Other Findings   Reproductive/Obstetrics negative OB ROS                             Anesthesia Physical Anesthesia Plan  ASA: 2  Anesthesia Plan: MAC and Regional   Post-op Pain Management: Regional block*   Induction:   PONV Risk Score and Plan: 2 and Propofol infusion and TIVA  Airway Management Planned: Natural Airway and Simple Face Mask  Additional Equipment: None  Intra-op Plan:   Post-operative Plan:   Informed Consent: I have reviewed the patients History and Physical, chart, labs and discussed the procedure including the risks, benefits and alternatives for the proposed anesthesia with the patient or authorized representative who has indicated his/her understanding and acceptance.       Plan Discussed with: CRNA  Anesthesia Plan Comments:        Anesthesia Quick Evaluation

## 2023-08-19 NOTE — Op Note (Signed)
Date of Surgery: 08/19/2023  INDICATIONS: Patient is a 58 y.o.-year-old male with a workplace injury to the left wrist in the setting of multiple previous wrist injuries.  An MRI was obtained which suggested degeneration of the TFCC with intact volar and dorsal radial ulnar ligaments but with concern for near complete rupture of the dorsal band of the scapholunate ligament.  The study also suggested at least mild joint space narrowing at the radiocarpal and midcarpal joints.  Patient presents today for left wrist arthroscopy with possible scapholunate ligament reconstruction depending on the arthroscopic findings.  Risks, benefits, and alternatives to surgery were again discussed with the patient in the preoperative area. The patient wishes to proceed with surgery.  Informed consent was signed after our discussion.   PREOPERATIVE DIAGNOSIS:  Left scapholunate ligament rupture Left TFCC degeneration  POSTOPERATIVE DIAGNOSIS:  Complete left scapholunate ligament rupture Left TFCC degeneration without discrete foveal tear Mild to moderate degenerative changes at lunate facet of radius with full thickness chondral loss at radial and volar aspect of lunate facet Mild to moderate degenerative changes at the distal pole of the scaphoid  PROCEDURE:  Left wrist arthroscopy Debridement of chronic peripheral TFCC tear Debridement of scapholunate ligament tear Left wrist synovectomy   SURGEON: Waylan Rocher, M.D.  ASSIST: None  ANESTHESIA:  Regional, MAC  IV FLUIDS AND URINE: See anesthesia.  ESTIMATED BLOOD LOSS: <5 mL.  IMPLANTS: * No implants in log *   DRAINS: None  COMPLICATIONS: None  DESCRIPTION OF PROCEDURE: The patient was met in the preoperative holding area where the surgical site was marked and the consent form was signed.  A regional block performed by anesthesia. The patient was then taken to the operating room and remained on the stretcher.  A hand table was placed  adjacent to the left upper extremity and locked into place.  All bony prominences were well padded.  A tourniquet was applied to the left upper arm.  Monitored sedation was induced.  The operative extremity was prepped and draped in the usual and sterile fashion.  A formal time-out was performed to confirm that this was the correct patient, surgery, side, and site.   Following formal timeout, the arthroscopy tower was assembled and placed on the hand table.  The limb was exsanguinated with Esmarch bandage and the tourniquet inflated to 250 mmHg.  The left arm was positioned appropriately and the arthroscopic tower and 10 pounds of traction applied.  I began by making the 3-4 portal.  A small incision was made through the skin in line with the portal.  Blunt dissection with a hemostat was used to get through the subcutaneous tissue.  This hemostat was then advanced into the joint.  This was followed by the trocar and arthroscope.  I performed a dry arthroscopy initially so as to not disrupt the capsule and tissue planes should we proceed with scapholunate ligament reconstruction.  The scapholunate ligament rupture was immediately apparent.  It appeared to be full-thickness involving the volar, membranous, and dorsal aspects.  There was some dorsal and radial synovitis present.  Examination of the scaphoid revealed mild to moderate degenerative changes at the distal pole of the scaphoid adjacent to the tip of the radial styloid.  The articular cartilage at the scaphoid facet of the radius was well-maintained.  Examination of the lunate facet showed moderate chondral thinning at the midportion of the facet with full-thickness cartilage loss at the radial and volar aspect of the lunate facet.  There was  a moderately sized flap of articular cartilage at the radial margin of the lunate facet.  The lunotriquetral ligament was intact.  The TFCC was overall intact but with some degenerative fraying at the periphery.  The  foveal insertion appeared to be intact via trampoline test.  Some synovitis at the ulnar aspect of the wrist around the TFCC and capsule.  Given the mild to moderate degenerative changes at the distal pole of the scaphoid as well as at the lunate facet, I did not think that scapholunate ligament reconstruction would provide durable symptom relief.  I therefore elected to proceed with debridement.  The arthroscope was connected to the sterile saline via gravity.  The 6R portal was established.  The sucker shaver was introduced into the joint.  Debridement of the periphery of the TFCC and associated ulnar-sided synovitis was performed.  Debridement of the dorsal capsular synovitis was also performed.  I then established the 4-5 portal.  I used a more aggressive sucker shaver to debride the full-thickness TFCC tear.  I used a sucker shaver to debride any loose chondral flaps.  At this point, the camera was removed.  I used a hemostat to access the midcarpal joint.  The trocar followed by the camera was then introduced into the midcarpal joint.  The head of the capitate was identified and was found to be pristine.  There was full-thickness rupture of the scapholunate ligament with significant fraying present.  The lunotriquetral ligament was intact.  At this point, the camera was withdrawn.  The arm was cleaned and the portal incisions closed using a 4-0 nylon suture.  The extremity was then dressed with Xeroform, folded Kerlix, cast padding, and a well-padded volar splint was applied.   The patient was reversed from sedation.  All counts were correct x 2 at the end of the procedure.  The patient was then taken to the PACU in stable condition.   POSTOPERATIVE PLAN: He will be discharged home with appropriate pain medication and discharge instructions.  I will see him back in 10 to 14 days for his first postop visit.  We will discuss potential salvage procedure at that time.  Waylan Rocher, MD 10:51 AM

## 2023-08-19 NOTE — Interval H&P Note (Signed)
History and Physical Interval Note:  08/19/2023 7:24 AM  Jeff Lee  has presented today for surgery, with the diagnosis of Rupture of scapholunate ligament of wrist.  The various methods of treatment have been discussed with the patient and family. After consideration of risks, benefits and other options for treatment, the patient has consented to  Procedure(s) with comments: WRIST ARTHROSCOPY WITH SYNOVECTOMY/DEBRIDEMENT (Left) - BLOCK AND MAC POSSIBLE SCAPHOLUNATE LIGAMENT REPAIR OR RECONSTRUCTION (Left) as a surgical intervention.  The patient's history has been reviewed, patient examined, no change in status, stable for surgery.  I have reviewed the patient's chart and labs.  Questions were answered to the patient's satisfaction.     Adalina Dopson

## 2023-08-19 NOTE — Transfer of Care (Signed)
Immediate Anesthesia Transfer of Care Note  Patient: Jeff Lee  Procedure(s) Performed: WRIST ARTHROSCOPY WITH SYNOVECTOMY/DEBRIDEMENT (Left: Wrist)  Patient Location: PACU  Anesthesia Type:MAC and Regional  Level of Consciousness: awake, alert , and oriented  Airway & Oxygen Therapy: Patient Spontanous Breathing and Patient connected to face mask oxygen  Post-op Assessment: Report given to RN and Post -op Vital signs reviewed and stable  Post vital signs: Reviewed and stable  Last Vitals:  Vitals Value Taken Time  BP 138/79 08/19/23 0945  Temp    Pulse 64 08/19/23 0946  Resp 16 08/19/23 0946  SpO2 97 % 08/19/23 0946  Vitals shown include unfiled device data.  Last Pain:  Vitals:   08/19/23 0642  TempSrc: Temporal  PainSc: 2          Complications: No notable events documented.

## 2023-08-19 NOTE — Progress Notes (Signed)
Assisted Dr. Finucane with left, supraclavicular, ultrasound guided block. Side rails up, monitors on throughout procedure. See vital signs in flow sheet. Tolerated Procedure well. 

## 2023-08-19 NOTE — Anesthesia Postprocedure Evaluation (Signed)
Anesthesia Post Note  Patient: Jeff Lee  Procedure(s) Performed: WRIST ARTHROSCOPY WITH SYNOVECTOMY/DEBRIDEMENT (Left: Wrist)     Patient location during evaluation: PACU Anesthesia Type: Regional and MAC Level of consciousness: awake and alert Pain management: pain level controlled Vital Signs Assessment: post-procedure vital signs reviewed and stable Respiratory status: spontaneous breathing, nonlabored ventilation and respiratory function stable Cardiovascular status: blood pressure returned to baseline and stable Postop Assessment: no apparent nausea or vomiting Anesthetic complications: no   There were no known notable events for this encounter.  Last Vitals:  Vitals:   08/19/23 1015 08/19/23 1027  BP: (!) 148/88 (!) 143/78  Pulse: 73 75  Resp: 17 20  Temp:  (!) 36.3 C  SpO2: 94% 93%    Last Pain:  Vitals:   08/19/23 1027  TempSrc:   PainSc: 0-No pain                 Lannie Fields

## 2023-08-20 ENCOUNTER — Encounter (HOSPITAL_BASED_OUTPATIENT_CLINIC_OR_DEPARTMENT_OTHER): Payer: Self-pay | Admitting: Orthopedic Surgery

## 2023-09-23 ENCOUNTER — Encounter (HOSPITAL_BASED_OUTPATIENT_CLINIC_OR_DEPARTMENT_OTHER): Payer: Self-pay | Admitting: Orthopedic Surgery

## 2023-09-23 ENCOUNTER — Other Ambulatory Visit: Payer: Self-pay

## 2023-09-30 ENCOUNTER — Ambulatory Visit (HOSPITAL_BASED_OUTPATIENT_CLINIC_OR_DEPARTMENT_OTHER)
Admission: RE | Admit: 2023-09-30 | Discharge: 2023-09-30 | Disposition: A | Discharge status: Home / Self Care | Payer: Worker's Compensation | Attending: Orthopedic Surgery | Admitting: Orthopedic Surgery

## 2023-09-30 ENCOUNTER — Encounter (HOSPITAL_BASED_OUTPATIENT_CLINIC_OR_DEPARTMENT_OTHER): Payer: Self-pay | Admitting: Orthopedic Surgery

## 2023-09-30 ENCOUNTER — Ambulatory Visit (HOSPITAL_BASED_OUTPATIENT_CLINIC_OR_DEPARTMENT_OTHER): Payer: Worker's Compensation | Admitting: Anesthesiology

## 2023-09-30 ENCOUNTER — Encounter (HOSPITAL_BASED_OUTPATIENT_CLINIC_OR_DEPARTMENT_OTHER)
Admission: RE | Disposition: A | Discharge status: Home / Self Care | Payer: Self-pay | Source: Home / Self Care | Attending: Orthopedic Surgery

## 2023-09-30 ENCOUNTER — Ambulatory Visit (HOSPITAL_BASED_OUTPATIENT_CLINIC_OR_DEPARTMENT_OTHER): Payer: Worker's Compensation

## 2023-09-30 ENCOUNTER — Other Ambulatory Visit: Payer: Self-pay

## 2023-09-30 DIAGNOSIS — M19032 Primary osteoarthritis, left wrist: Secondary | ICD-10-CM | POA: Diagnosis not present

## 2023-09-30 DIAGNOSIS — M1812 Unilateral primary osteoarthritis of first carpometacarpal joint, left hand: Secondary | ICD-10-CM

## 2023-09-30 DIAGNOSIS — F32A Depression, unspecified: Secondary | ICD-10-CM | POA: Diagnosis not present

## 2023-09-30 DIAGNOSIS — K219 Gastro-esophageal reflux disease without esophagitis: Secondary | ICD-10-CM | POA: Diagnosis not present

## 2023-09-30 DIAGNOSIS — X58XXXA Exposure to other specified factors, initial encounter: Secondary | ICD-10-CM | POA: Diagnosis not present

## 2023-09-30 DIAGNOSIS — S63392A Traumatic rupture of other ligament of left wrist, initial encounter: Secondary | ICD-10-CM | POA: Diagnosis present

## 2023-09-30 DIAGNOSIS — Z6832 Body mass index (BMI) 32.0-32.9, adult: Secondary | ICD-10-CM | POA: Diagnosis not present

## 2023-09-30 DIAGNOSIS — E669 Obesity, unspecified: Secondary | ICD-10-CM | POA: Diagnosis not present

## 2023-09-30 DIAGNOSIS — Z01818 Encounter for other preprocedural examination: Secondary | ICD-10-CM

## 2023-09-30 HISTORY — PX: TENDON TRANSFER: SHX6109

## 2023-09-30 HISTORY — PX: WRIST FUSION: SHX839

## 2023-09-30 SURGERY — FUSION, JOINT, WRIST
Anesthesia: Regional | Site: Wrist | Laterality: Left

## 2023-09-30 MED ORDER — LIDOCAINE 2% (20 MG/ML) 5 ML SYRINGE
INTRAMUSCULAR | Status: DC | PRN
  Administered 2023-09-30: 40 mg via INTRAVENOUS

## 2023-09-30 MED ORDER — MIDAZOLAM HCL 2 MG/2ML IJ SOLN
2.0000 mg | Freq: Once | INTRAMUSCULAR | Status: AC
  Administered 2023-09-30: 2 mg via INTRAVENOUS

## 2023-09-30 MED ORDER — CEFAZOLIN SODIUM-DEXTROSE 3-4 GM/150ML-% IV SOLN
3.0000 g | INTRAVENOUS | Status: AC
  Administered 2023-09-30: 2 g via INTRAVENOUS

## 2023-09-30 MED ORDER — OXYCODONE HCL 5 MG PO TABS: 5.0000 mg | ORAL_TABLET | ORAL | 0 refills | Status: AC | PRN

## 2023-09-30 MED ORDER — FENTANYL CITRATE (PF) 100 MCG/2ML IJ SOLN: 25.0000 ug | INTRAMUSCULAR | Status: DC | PRN

## 2023-09-30 MED ORDER — BUPIVACAINE-EPINEPHRINE (PF) 0.5% -1:200000 IJ SOLN
INTRAMUSCULAR | Status: DC | PRN
  Administered 2023-09-30: 30 mL via PERINEURAL

## 2023-09-30 MED ORDER — PROPOFOL 10 MG/ML IV BOLUS
INTRAVENOUS | Status: DC | PRN
  Administered 2023-09-30: 20 mg via INTRAVENOUS
  Administered 2023-09-30 (×2): 10 mg via INTRAVENOUS

## 2023-09-30 MED ORDER — PROPOFOL 10 MG/ML IV BOLUS
INTRAVENOUS | Status: AC
  Filled 2023-09-30: qty 20

## 2023-09-30 MED ORDER — DEXMEDETOMIDINE HCL IN NACL 80 MCG/20ML IV SOLN
INTRAVENOUS | Status: DC | PRN
  Administered 2023-09-30: 8 ug via INTRAVENOUS

## 2023-09-30 MED ORDER — AMISULPRIDE (ANTIEMETIC) 5 MG/2ML IV SOLN: 10.0000 mg | Freq: Once | INTRAVENOUS | Status: DC | PRN

## 2023-09-30 MED ORDER — CEFAZOLIN SODIUM-DEXTROSE 2-4 GM/100ML-% IV SOLN
INTRAVENOUS | Status: AC
  Filled 2023-09-30: qty 100

## 2023-09-30 MED ORDER — FENTANYL CITRATE (PF) 100 MCG/2ML IJ SOLN
INTRAMUSCULAR | Status: AC
  Filled 2023-09-30: qty 2

## 2023-09-30 MED ORDER — ONDANSETRON HCL 4 MG/2ML IJ SOLN
INTRAMUSCULAR | Status: AC
  Filled 2023-09-30: qty 2

## 2023-09-30 MED ORDER — OXYCODONE HCL 5 MG/5ML PO SOLN: 5.0000 mg | Freq: Once | ORAL | Status: DC | PRN

## 2023-09-30 MED ORDER — PROPOFOL 500 MG/50ML IV EMUL
INTRAVENOUS | Status: DC | PRN
  Administered 2023-09-30: 125 ug/kg/min via INTRAVENOUS

## 2023-09-30 MED ORDER — SODIUM CHLORIDE 0.9 % IV SOLN: 12.5000 mg | INTRAVENOUS | Status: DC | PRN

## 2023-09-30 MED ORDER — ONDANSETRON HCL 4 MG/2ML IJ SOLN
INTRAMUSCULAR | Status: DC | PRN
  Administered 2023-09-30: 4 mg via INTRAVENOUS

## 2023-09-30 MED ORDER — PHENYLEPHRINE HCL (PRESSORS) 10 MG/ML IV SOLN
INTRAVENOUS | Status: DC | PRN
  Administered 2023-09-30 (×5): 80 ug via INTRAVENOUS

## 2023-09-30 MED ORDER — LACTATED RINGERS IV SOLN: INTRAVENOUS | Status: DC

## 2023-09-30 MED ORDER — MIDAZOLAM HCL 2 MG/2ML IJ SOLN
INTRAMUSCULAR | Status: AC
  Filled 2023-09-30: qty 2

## 2023-09-30 MED ORDER — FENTANYL CITRATE (PF) 100 MCG/2ML IJ SOLN
100.0000 ug | Freq: Once | INTRAMUSCULAR | Status: AC
  Administered 2023-09-30: 50 ug via INTRAVENOUS

## 2023-09-30 MED ORDER — OXYCODONE HCL 5 MG PO TABS: 5.0000 mg | ORAL_TABLET | Freq: Once | ORAL | Status: DC | PRN

## 2023-09-30 MED ORDER — ACETAMINOPHEN 500 MG PO TABS
1000.0000 mg | ORAL_TABLET | Freq: Once | ORAL | Status: AC
  Administered 2023-09-30: 1000 mg via ORAL

## 2023-09-30 MED ORDER — 0.9 % SODIUM CHLORIDE (POUR BTL) OPTIME
TOPICAL | Status: DC | PRN
  Administered 2023-09-30: 300 mL

## 2023-09-30 MED ORDER — ACETAMINOPHEN 500 MG PO TABS
ORAL_TABLET | ORAL | Status: AC
  Filled 2023-09-30: qty 2

## 2023-09-30 SURGICAL SUPPLY — 50 items
BIT DRILL 1.6 90 (DRILL) IMPLANT
BLADE SURG 15 STRL LF DISP TIS (BLADE) ×1 IMPLANT
BNDG ELASTIC 3INX 5YD STR LF (GAUZE/BANDAGES/DRESSINGS) ×1 IMPLANT
BNDG ESMARK 4X9 LF (GAUZE/BANDAGES/DRESSINGS) ×1 IMPLANT
BNDG GAUZE DERMACEA FLUFF 4 (GAUZE/BANDAGES/DRESSINGS) ×1 IMPLANT
BUR PEAR (BURR) IMPLANT
BUR ROUND 2.3X44.5 8 FLUTES (BURR) IMPLANT
CHLORAPREP W/TINT 26 (MISCELLANEOUS) ×1 IMPLANT
CORD BIPOLAR FORCEPS 12FT (ELECTRODE) ×1 IMPLANT
COVER BACK TABLE 60X90IN (DRAPES) ×1 IMPLANT
CUFF TOURN SGL QUICK 18X4 (TOURNIQUET CUFF) ×1 IMPLANT
CUFF TRNQT CYL 24X4X16.5-23 (TOURNIQUET CUFF) IMPLANT
CUP FUSION 18MM DIA HOLE X10 (Cup) IMPLANT
DRAPE EXTREMITY T 121X128X90 (DISPOSABLE) ×1 IMPLANT
DRAPE OEC MINIVIEW 54X84 (DRAPES) ×1 IMPLANT
DRAPE SURG 17X23 STRL (DRAPES) ×1 IMPLANT
GAUZE SPONGE 4X4 12PLY STRL (GAUZE/BANDAGES/DRESSINGS) IMPLANT
GAUZE XEROFORM 1X8 LF (GAUZE/BANDAGES/DRESSINGS) ×1 IMPLANT
GLOVE BIO SURGEON STRL SZ7 (GLOVE) ×1 IMPLANT
GLOVE BIOGEL PI IND STRL 7.5 (GLOVE) IMPLANT
GOWN STRL REUS W/ TWL LRG LVL3 (GOWN DISPOSABLE) ×2 IMPLANT
K-WIRE DBL .045X4 NSTRL (WIRE) ×4 IMPLANT
K-WIRE FIXATION 0.8X80 (WIRE) ×2 IMPLANT
K-WIRE FX100X1.6XNS KRSH (WIRE) ×1 IMPLANT
KWIRE DBL .045X4 NSTRL (WIRE) IMPLANT
KWIRE FIXATION 0.8X80 (WIRE) IMPLANT
KWIRE FX100X1.6XNS KRSH (WIRE) IMPLANT
NDL HYPO 25X1 1.5 SAFETY (NEEDLE) IMPLANT
NEEDLE HYPO 25X1 1.5 SAFETY (NEEDLE) IMPLANT
NS IRRIG 1000ML POUR BTL (IV SOLUTION) ×1 IMPLANT
PACK BASIN DAY SURGERY FS (CUSTOM PROCEDURE TRAY) ×1 IMPLANT
PAD CAST 3X4 CTTN HI CHSV (CAST SUPPLIES) IMPLANT
SCREW LOCK 14X2.4X ELB (Screw) IMPLANT
SCREW LOCK CORT 2.4X10 (Screw) IMPLANT
SCREW LOCK CORT 2.4X12MM (Screw) IMPLANT
SCREW LOCKING 2.4X8 (Screw) IMPLANT
SHEET MEDIUM DRAPE 40X70 STRL (DRAPES) ×1 IMPLANT
SLEEVE SCD COMPRESS KNEE MED (STOCKING) IMPLANT
SPLINT FIBERGLASS 4X30 (CAST SUPPLIES) IMPLANT
SPLINT PLASTER CAST XFAST 4X15 (CAST SUPPLIES) ×10 IMPLANT
SPONGE SURGIFOAM ABS GEL 12-7 (HEMOSTASIS) IMPLANT
SUCTION TUBE FRAZIER 10FR DISP (SUCTIONS) IMPLANT
SUT ETHILON 4 0 PS 2 18 (SUTURE) ×1 IMPLANT
SUT MNCRL AB 3-0 PS2 18 (SUTURE) ×1 IMPLANT
SUT VIC AB 3-0 SH 27X BRD (SUTURE) IMPLANT
SUT VIC AB 4-0 PS2 18 (SUTURE) IMPLANT
SYR BULB EAR ULCER 3OZ GRN STR (SYRINGE) IMPLANT
SYR CONTROL 10ML LL (SYRINGE) IMPLANT
TOWEL GREEN STERILE FF (TOWEL DISPOSABLE) ×2 IMPLANT
UNDERPAD 30X36 HEAVY ABSORB (UNDERPADS AND DIAPERS) ×1 IMPLANT

## 2023-09-30 NOTE — Transfer of Care (Signed)
 Immediate Anesthesia Transfer of Care Note  Patient: Jeff Lee  Procedure(s) Performed: SCAPHOIDECTOMY WITH FOUR CORNER FUSION (Left: Wrist) TENDON TRANSFER AND POSTERIOR INTEROSSEOUS NERVE NEURECTOMY (Left: Wrist)  Patient Location: PACU  Anesthesia Type:MAC combined with regional for post-op pain  Level of Consciousness: sedated  Airway & Oxygen Therapy: Patient Spontanous Breathing and Patient connected to face mask oxygen  Post-op Assessment: Report given to RN and Post -op Vital signs reviewed and stable  Post vital signs: Reviewed and stable  Last Vitals:  Vitals Value Taken Time  BP 113/78 09/30/23 1257  Temp    Pulse 64 09/30/23 1259  Resp 26 09/30/23 1259  SpO2 93 % 09/30/23 1259  Vitals shown include unfiled device data.  Last Pain:  Vitals:   09/30/23 0735  TempSrc: Temporal  PainSc: 0-No pain         Complications: No notable events documented.

## 2023-09-30 NOTE — Progress Notes (Signed)
 Assisted Dr. Mal Amabile with left, supraclavicular, ultrasound guided block. Side rails up, monitors on throughout procedure. See vital signs in flow sheet. Tolerated Procedure well.

## 2023-09-30 NOTE — Op Note (Addendum)
 Date of Surgery: 09/30/2023  INDICATIONS: Patient is a 58 y.o.-year-old male with a workplace injury to the left wrist.  He was found to have complete rupture of the scapholunate ligament.  Wrist arthroscopy was performed which demonstrated complete rupture of both the dorsal and volar portions of the scapholunate ligament with evidence of mild radioscaphoid degenerative change.  Given his mild underlying degenerative change, complete scapholunate ligament rupture, and high demands as a labor, we felt it best that he undergo a scaphoidectomy and 4 corner fusion to provide durable carpal stability and pain relief.  Risks, benefits, and alternatives to surgery were again discussed with the patient in the preoperative area. The patient wishes to proceed with surgery.  Informed consent was signed after our discussion.   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  PROCEDURE:  Left scaphoidectomy Left four corner fusion with dorsal plate PIN neurectomy for chronic pain Left EPL tendon transfer   SURGEON: Waylan Rocher, M.D.  ASSIST:   ANESTHESIA:  Regional, MAC  IV FLUIDS AND URINE: See anesthesia.  ESTIMATED BLOOD LOSS: 20 mL.  IMPLANTS:  Implant Name Type Inv. Item Serial No. Manufacturer Lot No. LRB No. Used Action  fusion cup 10 hole Orthopedic Implant   TRIMED  Left 1 Implanted  2.4 locking screw Screw   TRIMED  Left 2 Implanted  SCREW LOCK CORT 2.4X12MM - [id number] Screw SCREW LOCK CORT 2.4X12MM  TRIMED  Left 2 Implanted  SCREW LOCK 14X2.4X ELB - [id number] Screw SCREW LOCK 14X2.4X ELB  TRIMED  Left 1 Implanted  2.4 locking screw Screw   TRIMED  Left 1 Implanted  SCREW LOCK 14X2.4X ELB - [id number] Screw SCREW LOCK 14X2.4X ELB  TRIMED  Left 4 Implanted  SCREW LOCK 14X2.4X ELB - [id number] Screw SCREW LOCK 14X2.4X ELB  TRIMED  Left 1 Implanted and Explanted     DRAINS: None  COMPLICATIONS:  None  DESCRIPTION OF PROCEDURE: The patient was met in the preoperative holding area where the surgical site was marked and the consent form was signed.  The patient was then taken to the operating room and remained on the stretcher.  A hand table was placed adjacent to the left upper extremity and locked into plate.  All bony prominences were well padded.  A tourniquet was applied to the left upper arm.  Monitored sedation was induced.  The operative extremity was prepped and draped in the usual and sterile fashion.  A formal time-out was performed to confirm that this was the correct patient, surgery, side, and site.   Following formal timeout, the limb was exsanguinated by an Esmarch bandage and the tourniquet inflated to 250 mmHg.  I began by making a longitudinal incision just ulnar to Lister's tubercle.  The skin was incised.  Small crossing vessels were coagulated as needed.  Blunt dissection was used to identify the underlying extensor retinaculum.  Full-thickness skin flaps were elevated.  The EPL tendon was identified and the third compartment was opened.  The EPL tendon was retracted radially.  A Z-plasty was made in the extensor retinaculum overlying the fourth dorsal compartment tendons to allow for later repair.  The fourth dorsal compartment tendons were retracted ulnarly.  The posterior interosseous nerve was identified and a 1 cm segment of the nerve sharply excised.  An inverted T shaped capsulotomy was performed in the wrist capsule.  This was extended all the way to the radial styloid as well as beyond the trapezium.  The scaphoid was identified.  There was complete rupture of the scapholunate ligament with a flexed posture of the scaphoid and DISI posture of the lunate.  Using a combination of knife, McGlamery elevator, and rongeur, the scaphoid was removed.  The underlying radioscaphocapitate ligament was intact.  At this point I turned my attention to the 4 corner fusion.  The articular  surfaces of the distal lunate, proximal capitate, distal triquetrum, and proximal hamate were removed of all articular cartilage and subchondral bone using a combination of rongeurs, curette, and small bur until bleeding cancellous bone was identified.  At this point, Lister's tubercle was removed sharply using a rongeur.  A curette was used to obtain autograft.  A 0.04 inch K wire was then placed in the lunate.  This served as a joystick to reduce the lunate out of its extended position.  With the lunate reduced, it was pinned to the distal radius.  I then reduced the capitate onto the lunate and secured this with another 0.045 inch K wire.  A third 0.045 inch K wire was used to reduce and pin the triquetral hamate joint.  The joint surfaces were well opposed.  I then identified the starting point for the reamer.  This coincided with the proximal tip of the hamate.  The dorsal aspect of the carpus was carefully reamed to allow for appropriate positioning of the plate to prevent impingement.  Some bone graft was then placed at the fusion sites as necessary.  The plate was then applied and fixed using 2 K wires.  I then filled the holes of the plate with unicortical screws such that each bone was fixed with 2 screws.  The screws were not tightened all the way.  Once all of the appropriate holes in the plate were filled, the screws were then tightened in a star pattern to ensure even pressure across the plate.  AP, oblique, and lateral views showed that the screws were in appropriate position.  The all appeared to be unicortical.  Passive range of motion of wrist showed that the for carpal bones moved as a unit.  There was no impingement with extension of the wrist.  At this point, the wound was thoroughly irrigated with copious sterile saline.  The capsulotomy was closed using a 3-0 Vicryl suture in combination of simple and figure-of-eight fashion.  The extensor retinaculum over the fourth dorsal compartment was  then repaired.  The retinaculum over the third dorsal compartment was similarly repaired with the EPL tendon remaining transposed.  The tourniquet was then deflated.  Hemostasis was achieved with direct pressure over the wound and with bipolar electrocautery.  The skin was then closed in layered fashion using a 3-0 Monocryl suture in a buried erupted fashion followed by a 4-0 nylon suture in horizontal fashion.  The wound was dressed with Xeroform, folded Kerlix, cast padding, and a well-padded volar splint was applied.  The patient was reversed from sedation.  All counts were correct x 2 at the end of the procedure.  The patient was then taken to the PACU in stable condition.   POSTOPERATIVE PLAN: He will be discharged to home with appropriate pain medication and discharge instructions.  I will see him back in the office in 10 to 14 days for his first postop visit.  We will get repeat x-rays of the wrist out of the splint.  I will transition him to a short arm cast at that point.  Waylan Rocher, MD 1:05 PM

## 2023-09-30 NOTE — Discharge Instructions (Addendum)
 Waylan Rocher, M.D. Hand Surgery  POST-OPERATIVE DISCHARGE INSTRUCTIONS   PRESCRIPTIONS: You may have been given a prescription to be taken as directed for post-operative pain control.  You may also take over the counter ibuprofen/aleve and tylenol for pain. Take this as directed on the packaging. Do not exceed 3000 mg tylenol/acetaminophen in 24 hours.  Ibuprofen 600-800 mg (3-4) tablets by mouth every 6 hours as needed for pain.  OR Aleve 2 tablets by mouth every 12 hours (twice daily) as needed for pain.  AND/OR Tylenol 1000 mg (2 tablets) every 8 hours as needed for pain.  Please use your pain medication carefully, as refills are limited and you may not be provided with one.  As stated above, please use over the counter pain medicine - it will also be helpful with decreasing your swelling.    ANESTHESIA: After your surgery, post-surgical discomfort or pain is likely. This discomfort can last several days to a few weeks. At certain times of the day your discomfort may be more intense.   Did you receive a nerve block?  A nerve block can provide pain relief for one hour to two days after your surgery. As long as the nerve block is working, you will experience little or no sensation in the area the surgeon operated on.  As the nerve block wears off, you will begin to experience pain or discomfort. It is very important that you begin taking your prescribed pain medication before the nerve block fully wears off. Treating your pain at the first sign of the block wearing off will ensure your pain is better controlled and more tolerable when full-sensation returns. Do not wait until the pain is intolerable, as the medicine will be less effective. It is better to treat pain in advance than to try and catch up.   General Anesthesia:  If you did not receive a nerve block during your surgery, you will need to start taking your pain medication shortly after your surgery and should continue  to do so as prescribed by your surgeon.     ICE AND ELEVATION: You may use ice for the first 48-72 hours, but it is not critical.   Motion of your fingers is very important to decrease the swelling.  Elevation, as much as possible for the next 48 hours, is critical for decreasing swelling as well as for pain relief. Elevation means when you are seated or lying down, you hand should be at or above your heart. When walking, the hand needs to be at or above the level of your elbow.  If the bandage gets too tight, it may need to be loosened. Please contact our office and we will instruct you in how to do this.    SURGICAL BANDAGES:  Keep your dressing and/or splint clean and dry at all times.  Do not remove until you are seen again in the office.  If careful, you may place a plastic bag over your bandage and tape the end to shower, but be careful, do not get your bandages wet.     HAND THERAPY:  You may not need any. If you do, we will begin this at your follow up visit in the clinic.    ACTIVITY AND WORK: You are encouraged to move any fingers which are not in the bandage.  Light use of the fingers is allowed to assist the other hand with daily hygiene and eating, but strong gripping or lifting is often uncomfortable and  should be avoided.  You might miss a variable period of time from work and hopefully this issue has been discussed prior to surgery. You may not do any heavy work with your affected hand for about 2 weeks.    [facility] Second Floor, [address] [place], [place] [address] [phone]   May have tylenol after 3:00pm

## 2023-09-30 NOTE — Anesthesia Postprocedure Evaluation (Signed)
 Anesthesia Post Note  Patient: Jeff Lee  Procedure(s) Performed: SCAPHOIDECTOMY WITH FOUR CORNER FUSION (Left: Wrist) TENDON TRANSFER AND POSTERIOR INTEROSSEOUS NERVE NEURECTOMY (Left: Wrist)     Patient location during evaluation: PACU Anesthesia Type: Regional Level of consciousness: awake and alert Pain management: pain level controlled Vital Signs Assessment: post-procedure vital signs reviewed and stable Respiratory status: spontaneous breathing, nonlabored ventilation and respiratory function stable Cardiovascular status: stable and blood pressure returned to baseline Anesthetic complications: no   No notable events documented.  Last Vitals:  Vitals:   09/30/23 1315 09/30/23 1351  BP: 93/66 114/71  Pulse: (!) 59 63  Resp: 13 16  Temp:  (!) 36.1 C  SpO2: 97% 93%    Last Pain:  Vitals:   09/30/23 1351  TempSrc:   PainSc: 0-No pain                 Beryle Lathe

## 2023-09-30 NOTE — H&P (Signed)
 HAND SURGERY   HPI: Patient is a 58 y.o. 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  He has persistent pain in the wrist that is severely limiting his ability to work or do his normal daily activities.  He has failed conservative management with activity modification, bracing, oral anti-inflammatory medications, and corticosteroid injection.  Patient denies any changes to their medical history or new systemic symptoms today.    Past Medical History:  Diagnosis Date   Anxiety    Arthritis    Arthritis of ankle    Post Traumatic, left   Depression    GERD (gastroesophageal reflux disease)    TAKES OTC   Past Surgical History:  Procedure Laterality Date   APPENDECTOMY     EYE SURGERY     LASER EYE & MUSCLE CORRECTION   HERNIA REPAIR     UMBILICAL & INGUINAL   TOTAL ANKLE ARTHROPLASTY Left 09/17/2017   Procedure: LEFT TOTAL ANKLE ARTHOPLASTY;  Surgeon: Toni Arthurs, MD;  Location: [facility] OR;  Service: Orthopedics;  Laterality: Left;   VASECTOMY     WRIST ARTHROSCOPY WITH DEBRIDEMENT Left 08/19/2023   Procedure: WRIST ARTHROSCOPY WITH SYNOVECTOMY/DEBRIDEMENT;  Surgeon: Marlyne Beards, MD;  Location: [facility];  Service: Orthopedics;  Laterality: Left;  BLOCK AND MAC   Social History   Socioeconomic History   Marital status: Married    Spouse name: Not on file   Number of children: Not on file   Years of education: Not on file   Highest education level: Not on file  Occupational History   Not on file  Tobacco Use   Smoking status: Never   Smokeless tobacco: Never  Vaping Use   Vaping status: Never Used  Substance and Sexual Activity   Alcohol use: Yes    Alcohol/week: 4.0 standard drinks of alcohol    Types: 4 Cans of beer per week    Comment: OCCASIONALLY   Drug use: No   Sexual activity: Yes  Other Topics Concern   Not on file  Social History Narrative   Not on file    Social Drivers of Health   Financial Resource Strain: Not on file  Food Insecurity: Not on file  Transportation Needs: Not on file  Physical Activity: Not on file  Stress: Not on file  Social Connections: Not on file   History reviewed. No pertinent family history. - negative except otherwise stated in the family history section No Known Allergies Prior to Admission medications   Medication Sig Start Date End Date Taking? Authorizing Provider  acetaminophen (TYLENOL) 500 MG tablet Take 2 tablets (1,000 mg total) by mouth daily as needed for moderate pain or headache. 09/18/17  Yes Jacinta Shoe, PA-C  buPROPion (WELLBUTRIN XL) 300 MG 24 hr tablet Take 300 mg by mouth daily.   Yes [provider]  ibuprofen (ADVIL,MOTRIN) 200 MG tablet Take 400 mg by mouth daily as needed for headache or moderate pain.   Yes [provider]  Multiple Vitamin (MULTIVITAMIN WITH MINERALS) TABS tablet Take 1 tablet by mouth daily.   Yes [provider]   DG MINI C-ARM IMAGE ONLY Result Date: 09/30/2023 There is no interpretation for this exam.  This order is for images obtained during a surgical procedure.  Please See "Surgeries" Tab for more information regarding the procedure.   - Positive ROS: All other systems have been reviewed and were otherwise negative with the exception of  those mentioned in the HPI and as above.  Physical Exam: General: No acute distress, resting comfortably Cardiovascular: BUE warm and well perfused, normal rate Respiratory: Normal WOB on RA Skin: Warm and dry Neurologic: Sensation intact distally Psychiatric: Patient is at baseline mood and affect  Left Upper Extremity  Mild swelling of the dorsal wrist with well healed portal incisions.  He has tenderness to palpation at the scapholunate interval and radioscaphoid interval.  He has limited AROM of his wrist compared to the contralateral side.  He has full AROM of his fingers.  SILT m/u/r  distribution.  Hand warm and well perfused w/ BCR.    Assessment: 58 yo M w/ complete left scapholunate ligament rupture with underlying arthritic changes.   Plan: OR today for left scaphoidectomy and four corner fusion with PIN neurectomy.  We again reviewed the risks of surgery which include bleeding, infection, damage to neurovascular structures, persistent symptoms, wrist stiffness, nonunion, symptomatic hardware, need for additional surgery.    Questions were encouraged and answered.   Marlyne Beards, M.D. [facility] 9:05 AM

## 2023-09-30 NOTE — Anesthesia Preprocedure Evaluation (Addendum)
 Anesthesia Evaluation  Patient identified by MRN, date of birth, ID band Patient awake    Reviewed: Allergy & Precautions, NPO status , Patient's Chart, lab work & pertinent test results  History of Anesthesia Complications Negative for: history of anesthetic complications  Airway Mallampati: I  TM Distance: >3 FB Neck ROM: Full    Dental  (+) Dental Advisory Given, Teeth Intact   Pulmonary neg pulmonary ROS   Pulmonary exam normal        Cardiovascular negative cardio ROS Normal cardiovascular exam     Neuro/Psych  PSYCHIATRIC DISORDERS Anxiety Depression    negative neurological ROS     GI/Hepatic Neg liver ROS,GERD  Controlled,,  Endo/Other   Obesity   Renal/GU negative Renal ROS     Musculoskeletal  (+) Arthritis ,    Abdominal   Peds  Hematology negative hematology ROS (+)   Anesthesia Other Findings   Reproductive/Obstetrics                             Anesthesia Physical Anesthesia Plan  ASA: 2  Anesthesia Plan: Regional   Post-op Pain Management: Regional block* and Tylenol PO (pre-op)*   Induction:   PONV Risk Score and Plan: 1 and Propofol infusion and Treatment may vary due to age or medical condition  Airway Management Planned: Natural Airway and Simple Face Mask  Additional Equipment: None  Intra-op Plan:   Post-operative Plan:   Informed Consent: I have reviewed the patients History and Physical, chart, labs and discussed the procedure including the risks, benefits and alternatives for the proposed anesthesia with the patient or authorized representative who has indicated his/her understanding and acceptance.       Plan Discussed with: CRNA and Anesthesiologist  Anesthesia Plan Comments:         Anesthesia Quick Evaluation

## 2023-09-30 NOTE — Anesthesia Procedure Notes (Signed)
 Anesthesia Regional Block: Supraclavicular block   Pre-Anesthetic Checklist: , timeout performed,  Correct Patient, Correct Site, Correct Laterality,  Correct Procedure, Correct Position, site marked,  Risks and benefits discussed,  Surgical consent,  Pre-op evaluation,  At surgeon's request and post-op pain management  Laterality: Left  Prep: chloraprep       Needles:  Injection technique: Single-shot  Needle Type: Echogenic Needle     Needle Length: 5cm  Needle Gauge: 21     Additional Needles:   Narrative:  Start time: 09/30/2023 9:13 AM End time: 09/30/2023 9:16 AM Injection made incrementally with aspirations every 5 mL.  Performed by: Personally  Anesthesiologist: Beryle Lathe, MD  Additional Notes: No pain on injection. No increased resistance to injection. Injection made in 5cc increments. Good needle visualization. Patient tolerated the procedure well.

## 2023-09-30 NOTE — Interval H&P Note (Signed)
 History and Physical Interval Note:  09/30/2023 9:10 AM  Jeff Lee  has presented today for surgery, with the diagnosis of Left scapholunate ligament rupture with osteoarthritis.  The various methods of treatment have been discussed with the patient and family. After consideration of risks, benefits and other options for treatment, the patient has consented to  Procedure(s) with comments: SCAPHOIDECTOMY WITH FOUR CORNER FUSION (Left) - regional 150 TENDON TRANSFER AND POSTERIOR INTEROSSEOUS NERVE NEURECTOMY (Left) - regional 150 as a surgical intervention.  The patient's history has been reviewed, patient examined, no change in status, stable for surgery.  I have reviewed the patient's chart and labs.  Questions were answered to the patient's satisfaction.     Tawanda Schall

## 2023-10-05 ENCOUNTER — Encounter (HOSPITAL_BASED_OUTPATIENT_CLINIC_OR_DEPARTMENT_OTHER): Payer: Self-pay | Admitting: Orthopedic Surgery

## 2023-10-09 DIAGNOSIS — E66811 Obesity, class 1: Secondary | ICD-10-CM | POA: Diagnosis not present

## 2023-10-19 DIAGNOSIS — J209 Acute bronchitis, unspecified: Secondary | ICD-10-CM | POA: Diagnosis not present

## 2023-11-24 DIAGNOSIS — F331 Major depressive disorder, recurrent, moderate: Secondary | ICD-10-CM | POA: Diagnosis not present

## 2023-12-02 DIAGNOSIS — F4320 Adjustment disorder, unspecified: Secondary | ICD-10-CM | POA: Diagnosis not present

## 2023-12-07 DIAGNOSIS — M542 Cervicalgia: Secondary | ICD-10-CM | POA: Diagnosis not present

## 2023-12-07 DIAGNOSIS — J302 Other seasonal allergic rhinitis: Secondary | ICD-10-CM | POA: Diagnosis not present

## 2023-12-17 DIAGNOSIS — F4320 Adjustment disorder, unspecified: Secondary | ICD-10-CM | POA: Diagnosis not present

## 2023-12-31 DIAGNOSIS — F4320 Adjustment disorder, unspecified: Secondary | ICD-10-CM | POA: Diagnosis not present
